# Patient Record
Sex: Female | Born: 1970 | Race: Black or African American | Hispanic: No | Marital: Married | Smoking: Never smoker
Health system: Southern US, Community
[De-identification: ages and names within clinical notes are randomized; demographics above are authoritative.]

## PROBLEM LIST (undated history)

## (undated) DIAGNOSIS — J45909 Unspecified asthma, uncomplicated: Secondary | ICD-10-CM

## (undated) DIAGNOSIS — L309 Dermatitis, unspecified: Secondary | ICD-10-CM

## (undated) DIAGNOSIS — K219 Gastro-esophageal reflux disease without esophagitis: Secondary | ICD-10-CM

## (undated) DIAGNOSIS — F419 Anxiety disorder, unspecified: Secondary | ICD-10-CM

## (undated) DIAGNOSIS — I10 Essential (primary) hypertension: Secondary | ICD-10-CM

## (undated) DIAGNOSIS — G4730 Sleep apnea, unspecified: Secondary | ICD-10-CM

## (undated) HISTORY — DX: Dermatitis, unspecified: L30.9

## (undated) HISTORY — PX: TUBAL LIGATION: SHX77

## (undated) HISTORY — DX: Unspecified asthma, uncomplicated: J45.909

---

## 2000-05-26 ENCOUNTER — Other Ambulatory Visit: Admission: RE | Admit: 2000-05-26 | Discharge: 2000-05-26 | Payer: Self-pay | Admitting: Gynecology

## 2000-05-29 ENCOUNTER — Encounter: Admission: RE | Admit: 2000-05-29 | Discharge: 2000-05-29 | Payer: Self-pay | Admitting: Gynecology

## 2000-05-29 ENCOUNTER — Encounter: Payer: Self-pay | Admitting: Gynecology

## 2010-01-10 ENCOUNTER — Emergency Department (HOSPITAL_COMMUNITY): Admission: EM | Admit: 2010-01-10 | Discharge: 2010-01-10 | Payer: Self-pay | Admitting: Emergency Medicine

## 2010-03-22 ENCOUNTER — Emergency Department (HOSPITAL_COMMUNITY)
Admission: EM | Admit: 2010-03-22 | Discharge: 2010-03-23 | Payer: Self-pay | Source: Home / Self Care | Admitting: Emergency Medicine

## 2010-03-26 ENCOUNTER — Emergency Department (HOSPITAL_COMMUNITY)
Admission: EM | Admit: 2010-03-26 | Discharge: 2010-03-26 | Payer: Self-pay | Source: Home / Self Care | Admitting: Emergency Medicine

## 2010-03-27 ENCOUNTER — Emergency Department (HOSPITAL_COMMUNITY)
Admission: EM | Admit: 2010-03-27 | Discharge: 2010-03-27 | Payer: Self-pay | Source: Home / Self Care | Admitting: Emergency Medicine

## 2010-04-20 ENCOUNTER — Emergency Department (HOSPITAL_COMMUNITY)
Admission: EM | Admit: 2010-04-20 | Discharge: 2010-04-20 | Payer: Self-pay | Source: Home / Self Care | Admitting: Emergency Medicine

## 2010-05-26 ENCOUNTER — Emergency Department (HOSPITAL_COMMUNITY)
Admission: EM | Admit: 2010-05-26 | Discharge: 2010-05-26 | Discharge status: Against Medical Advice | Payer: BC Managed Care – PPO | Attending: Emergency Medicine | Admitting: Emergency Medicine

## 2010-05-26 DIAGNOSIS — R110 Nausea: Secondary | ICD-10-CM | POA: Insufficient documentation

## 2010-05-26 DIAGNOSIS — E162 Hypoglycemia, unspecified: Secondary | ICD-10-CM | POA: Insufficient documentation

## 2010-05-26 LAB — GLUCOSE, CAPILLARY: Glucose-Capillary: 98 mg/dL (ref 70–99)

## 2010-06-10 LAB — BASIC METABOLIC PANEL
BUN: 10 mg/dL (ref 6–23)
CO2: 21 mEq/L (ref 19–32)
Calcium: 8.9 mg/dL (ref 8.4–10.5)
Chloride: 107 mEq/L (ref 96–112)
Creatinine, Ser: 0.76 mg/dL (ref 0.4–1.2)
GFR calc Af Amer: >60 mL/min (ref >60)
GFR calc non Af Amer: >60 mL/min (ref >60)
Glucose, Bld: 106 mg/dL — ABNORMAL HIGH (ref 70–99)
Potassium: 3.2 mEq/L — ABNORMAL LOW (ref 3.5–5.1)
Sodium: 136 mEq/L (ref 135–145)

## 2010-06-10 LAB — POCT CARDIAC MARKERS
CKMB, poc: <1 ng/mL — ABNORMAL LOW (ref 1.0–8.0)
CKMB, poc: <1 ng/mL — ABNORMAL LOW (ref 1.0–8.0)
CKMB, poc: <1 ng/mL — ABNORMAL LOW (ref 1.0–8.0)
Myoglobin, poc: 49.6 ng/mL (ref 12–200)
Myoglobin, poc: 50.5 ng/mL (ref 12–200)
Troponin i, poc: <0.05 ng/mL (ref 0.00–0.09)
Troponin i, poc: <0.05 ng/mL (ref 0.00–0.09)
Troponin i, poc: <0.05 ng/mL (ref 0.00–0.09)
Troponin i, poc: <0.05 ng/mL (ref 0.00–0.09)

## 2010-06-10 LAB — DIFFERENTIAL
Basophils Absolute: 0 10*3/uL (ref 0.0–0.1)
Eosinophils Absolute: 0.3 10*3/uL (ref 0.0–0.7)
Eosinophils Relative: 4 % (ref 0–5)

## 2010-06-10 LAB — CBC
MCH: 30.1 pg (ref 26.0–34.0)
MCV: 85.1 fL (ref 78.0–100.0)
Platelets: 289 10*3/uL (ref 150–400)
RDW: 12.5 % (ref 11.5–15.5)

## 2010-06-10 LAB — LIPASE, BLOOD: Lipase: 33 U/L (ref 11–59)

## 2010-06-13 LAB — URINALYSIS, ROUTINE W REFLEX MICROSCOPIC
Glucose, UA: NEGATIVE mg/dL
Nitrite: NEGATIVE
Protein, ur: 30 mg/dL — AB
Urobilinogen, UA: 1 mg/dL (ref 0.0–1.0)

## 2010-06-13 LAB — DIFFERENTIAL
Basophils Relative: 1 % (ref 0–1)
Monocytes Relative: 7 % (ref 3–12)
Neutro Abs: 2.3 10*3/uL (ref 1.7–7.7)
Neutrophils Relative %: 44 % (ref 43–77)

## 2010-06-13 LAB — CBC
Hemoglobin: 13.6 g/dL (ref 12.0–15.0)
MCHC: 35.5 g/dL (ref 30.0–36.0)
Platelets: 287 10*3/uL (ref 150–400)
RBC: 4.3 MIL/uL (ref 3.87–5.11)

## 2010-06-13 LAB — POCT I-STAT, CHEM 8
Chloride: 105 mEq/L (ref 96–112)
Creatinine, Ser: 0.9 mg/dL (ref 0.4–1.2)
Glucose, Bld: 109 mg/dL — ABNORMAL HIGH (ref 70–99)
Potassium: 3.5 mEq/L (ref 3.5–5.1)
Sodium: 140 mEq/L (ref 135–145)

## 2010-06-13 LAB — URINE CULTURE
Colony Count: NO GROWTH
Culture  Setup Time: 201110131005
Culture: NO GROWTH

## 2010-06-13 LAB — POCT CARDIAC MARKERS
CKMB, poc: <1 ng/mL — ABNORMAL LOW (ref 1.0–8.0)
Myoglobin, poc: 53.4 ng/mL (ref 12–200)
Troponin i, poc: <0.05 ng/mL (ref 0.00–0.09)

## 2010-06-13 LAB — URINE MICROSCOPIC-ADD ON

## 2010-06-13 LAB — RAPID URINE DRUG SCREEN, HOSP PERFORMED: Barbiturates: NOT DETECTED

## 2010-06-13 LAB — PREGNANCY, URINE: Preg Test, Ur: NEGATIVE

## 2010-08-27 ENCOUNTER — Emergency Department (HOSPITAL_COMMUNITY)
Admission: EM | Admit: 2010-08-27 | Discharge: 2010-08-27 | Disposition: A | Discharge status: Home / Self Care | Payer: BC Managed Care – PPO | Attending: Emergency Medicine | Admitting: Emergency Medicine

## 2010-08-27 DIAGNOSIS — F411 Generalized anxiety disorder: Secondary | ICD-10-CM | POA: Insufficient documentation

## 2010-08-27 DIAGNOSIS — R0789 Other chest pain: Secondary | ICD-10-CM | POA: Insufficient documentation

## 2010-08-27 DIAGNOSIS — R42 Dizziness and giddiness: Secondary | ICD-10-CM | POA: Insufficient documentation

## 2010-08-27 DIAGNOSIS — K219 Gastro-esophageal reflux disease without esophagitis: Secondary | ICD-10-CM | POA: Insufficient documentation

## 2010-08-27 LAB — CBC
HCT: 41.8 % (ref 36.0–46.0)
Hemoglobin: 14.3 g/dL (ref 12.0–15.0)
MCH: 29.2 pg (ref 26.0–34.0)
MCHC: 34.2 g/dL (ref 30.0–36.0)
MCV: 85.5 fL (ref 78.0–100.0)
Platelets: 319 K/uL (ref 150–400)
RBC: 4.89 MIL/uL (ref 3.87–5.11)
RDW: 12.3 % (ref 11.5–15.5)
WBC: 3.9 10*3/uL — ABNORMAL LOW (ref 4.0–10.5)

## 2010-08-27 LAB — DIFFERENTIAL
Basophils Absolute: 0 10*3/uL (ref 0.0–0.1)
Basophils Relative: 1 % (ref 0–1)
Eosinophils Absolute: 0.2 K/uL (ref 0.0–0.7)
Eosinophils Relative: 5 % (ref 0–5)
Lymphocytes Relative: 40 % (ref 12–46)
Lymphs Abs: 1.5 K/uL (ref 0.7–4.0)
Monocytes Absolute: 0.3 K/uL (ref 0.1–1.0)
Monocytes Relative: 7 % (ref 3–12)
Neutro Abs: 1.8 10*3/uL (ref 1.7–7.7)
Neutrophils Relative %: 47 % (ref 43–77)

## 2010-08-27 LAB — COMPREHENSIVE METABOLIC PANEL
ALT: 19 U/L (ref 0–35)
CO2: 24 mEq/L (ref 19–32)
Calcium: 9.2 mg/dL (ref 8.4–10.5)
Creatinine, Ser: 0.7 mg/dL (ref 0.4–1.2)
GFR calc non Af Amer: >60 mL/min (ref >60)
Glucose, Bld: 90 mg/dL (ref 70–99)
Sodium: 138 mEq/L (ref 135–145)
Total Protein: 7.3 g/dL (ref 6.0–8.3)

## 2010-08-27 LAB — COMPREHENSIVE METABOLIC PANEL WITH GFR
AST: 16 U/L (ref 0–37)
Albumin: 3.7 g/dL (ref 3.5–5.2)
Alkaline Phosphatase: 123 U/L — ABNORMAL HIGH (ref 39–117)
BUN: 12 mg/dL (ref 6–23)
Chloride: 102 meq/L (ref 96–112)
GFR calc Af Amer: >60 mL/min (ref >60)
Potassium: 3.6 meq/L (ref 3.5–5.1)
Total Bilirubin: 0.7 mg/dL (ref 0.3–1.2)

## 2010-08-27 LAB — CK TOTAL AND CKMB (NOT AT ARMC)
CK, MB: 1.5 ng/mL (ref 0.3–4.0)
Relative Index: 1.4 (ref 0.0–2.5)
Total CK: 107 U/L (ref 7–177)

## 2010-08-27 LAB — TROPONIN I: Troponin I: <0.3 ng/mL (ref <0.30)

## 2011-04-27 ENCOUNTER — Encounter (HOSPITAL_COMMUNITY): Payer: Self-pay | Admitting: *Deleted

## 2011-04-27 ENCOUNTER — Emergency Department (HOSPITAL_COMMUNITY)
Admission: EM | Admit: 2011-04-27 | Discharge: 2011-04-27 | Discharge status: Against Medical Advice | Payer: BC Managed Care – PPO | Attending: Emergency Medicine | Admitting: Emergency Medicine

## 2011-04-27 DIAGNOSIS — T148XXA Other injury of unspecified body region, initial encounter: Secondary | ICD-10-CM | POA: Insufficient documentation

## 2011-04-27 DIAGNOSIS — M79609 Pain in unspecified limb: Secondary | ICD-10-CM | POA: Insufficient documentation

## 2011-04-27 DIAGNOSIS — X58XXXA Exposure to other specified factors, initial encounter: Secondary | ICD-10-CM | POA: Insufficient documentation

## 2011-04-27 HISTORY — DX: Anxiety disorder, unspecified: F41.9

## 2011-04-27 HISTORY — DX: Gastro-esophageal reflux disease without esophagitis: K21.9

## 2011-04-27 MED ORDER — CYCLOBENZAPRINE HCL 10 MG PO TABS: 10.0000 mg | ORAL_TABLET | Freq: Three times a day (TID) | ORAL | Status: DC | PRN

## 2011-04-27 MED ORDER — OXYCODONE-ACETAMINOPHEN 5-325 MG PO TABS
2.0000 | ORAL_TABLET | Freq: Once | ORAL | Status: DC
  Filled 2011-04-27: qty 2

## 2011-04-27 NOTE — ED Provider Notes (Signed)
Medical screening examination/treatment/procedure(s) were performed by non-physician practitioner and as supervising physician I was immediately available for consultation/collaboration.   Joya Gaskins, MD 04/27/11 [phone]

## 2011-04-27 NOTE — ED Notes (Signed)
PT c/o R thigh pain that woke her from sleep. Pt denies injury. No evidence of bruising noted.

## 2011-04-27 NOTE — ED Notes (Signed)
Patient left AMA at this time.

## 2011-04-27 NOTE — ED Provider Notes (Signed)
History     CSN: [id number]  Arrival date & time 04/27/11  0035   First MD Initiated Contact with Patient 04/27/11 0214      Chief Complaint  Patient presents with  . Leg Pain     HPI  History provided by the patient. Patient is a 41 year old female with history of acid reflux and anxiety reports waking up from sleep with right groin and thigh pain. Pain is described as a sharp dull pain and ache. Pain is worse with palpation and movements of leg. Patient denies having any injury or trauma to her leg was asleep at that time. Patient denies any similar symptoms recently. Patient denies any back pain, numbness, tingling or weakness in legs. She denies rash or swelling. Patient has not taken anything for her symptoms. She denies any other aggravating or alleviating factors. Patient has no other significant past medical history.    Past Medical History  Diagnosis Date  . GERD (gastroesophageal reflux disease)   . Anxiety     History reviewed. No pertinent past surgical history.  History reviewed. No pertinent family history.  History  Substance Use Topics  . Smoking status: Never Smoker   . Smokeless tobacco: Not on file  . Alcohol Use: No    OB History    Grav Para Term Preterm Abortions TAB SAB Ect Mult Living                  Review of Systems  Constitutional: Negative for fever and chills.  Respiratory: Negative for cough and shortness of breath.   Cardiovascular: Negative for chest pain.  Gastrointestinal: Negative for nausea, vomiting and abdominal pain.  Genitourinary: Negative for dysuria, frequency, hematuria, flank pain, vaginal bleeding and vaginal discharge.  Musculoskeletal: Negative for myalgias and arthralgias.  All other systems reviewed and are negative.    Allergies  Review of patient's allergies indicates no known allergies.  Home Medications   Current Outpatient Rx  Name Route Sig Dispense Refill  . LORAZEPAM 0.5 MG PO TABS Oral Take 0.5 mg  by mouth as needed.      BP 127/87  Pulse 68  Temp(Src) 98.4 F (36.9 C) (Oral)  Resp 18  SpO2 100%  LMP 04/07/2011  Physical Exam  Nursing note and vitals reviewed. Constitutional: She is oriented to person, place, and time. She appears well-developed and well-nourished. No distress.  HENT:  Head: Normocephalic and atraumatic.  Cardiovascular: Normal rate and regular rhythm.   Pulmonary/Chest: Effort normal and breath sounds normal. No respiratory distress.  Abdominal: Soft. She exhibits no distension. There is no tenderness.  Musculoskeletal:       Pain with active range of motion of right lower leg. Pain with resistance to extension at right hip. Normal nonpainful passive range of motion in right hip and lower leg. Tenderness to palpation over the medial aspect of right upper leg over her quadriceps. Mild tenderness at the inguinal area. No lymphadenopathy, rash or skin changes. No swelling of leg. Normal femural pulse. Normal distal sensations and pulses her leg.  Neurological: She is alert and oriented to person, place, and time.  Skin: Skin is warm and dry. No rash noted.  Psychiatric: She has a normal mood and affect. Her behavior is normal.    ED Course  Procedures     1. Muscle strain       MDM  2:30AM patient seen and evaluated. Patient in no acute distress.  Patient discussed with attending physician. He will see  patient and evaluate.   Patient eloped without informing staff, she has left AMA prior to attending physician evaluation.      Angus Seller, [place] 04/27/11 [phone]

## 2012-06-02 ENCOUNTER — Other Ambulatory Visit: Payer: Self-pay | Admitting: Family Medicine

## 2012-06-03 ENCOUNTER — Ambulatory Visit: Payer: BC Managed Care – PPO

## 2012-06-04 ENCOUNTER — Ambulatory Visit: Payer: BC Managed Care – PPO

## 2012-06-16 ENCOUNTER — Other Ambulatory Visit (HOSPITAL_COMMUNITY): Payer: Self-pay | Admitting: Obstetrics and Gynecology

## 2012-06-16 ENCOUNTER — Encounter (HOSPITAL_COMMUNITY): Payer: Self-pay | Admitting: Pharmacist

## 2012-06-16 DIAGNOSIS — R1900 Intra-abdominal and pelvic swelling, mass and lump, unspecified site: Secondary | ICD-10-CM

## 2012-06-21 ENCOUNTER — Ambulatory Visit (HOSPITAL_COMMUNITY)
Admission: RE | Admit: 2012-06-21 | Discharge: 2012-06-21 | Disposition: A | Discharge status: Home / Self Care | Payer: BC Managed Care – PPO | Source: Ambulatory Visit | Attending: Obstetrics and Gynecology | Admitting: Obstetrics and Gynecology

## 2012-06-21 DIAGNOSIS — D252 Subserosal leiomyoma of uterus: Secondary | ICD-10-CM | POA: Insufficient documentation

## 2012-06-21 DIAGNOSIS — R1900 Intra-abdominal and pelvic swelling, mass and lump, unspecified site: Secondary | ICD-10-CM | POA: Insufficient documentation

## 2012-06-21 DIAGNOSIS — D251 Intramural leiomyoma of uterus: Secondary | ICD-10-CM | POA: Insufficient documentation

## 2012-06-21 DIAGNOSIS — N854 Malposition of uterus: Secondary | ICD-10-CM | POA: Insufficient documentation

## 2012-07-05 ENCOUNTER — Other Ambulatory Visit: Payer: Self-pay | Admitting: Obstetrics and Gynecology

## 2012-07-12 ENCOUNTER — Encounter (HOSPITAL_COMMUNITY)
Admission: RE | Admit: 2012-07-12 | Discharge: 2012-07-12 | Disposition: A | Discharge status: Home / Self Care | Payer: BC Managed Care – PPO | Source: Ambulatory Visit | Attending: Obstetrics and Gynecology | Admitting: Obstetrics and Gynecology

## 2012-07-12 ENCOUNTER — Encounter (HOSPITAL_COMMUNITY): Payer: Self-pay

## 2012-07-12 DIAGNOSIS — Z01818 Encounter for other preprocedural examination: Secondary | ICD-10-CM | POA: Insufficient documentation

## 2012-07-12 DIAGNOSIS — Z01812 Encounter for preprocedural laboratory examination: Secondary | ICD-10-CM | POA: Insufficient documentation

## 2012-07-12 HISTORY — DX: Sleep apnea, unspecified: G47.30

## 2012-07-12 LAB — SURGICAL PCR SCREEN
MRSA, PCR: NEGATIVE
Staphylococcus aureus: NEGATIVE

## 2012-07-12 LAB — CBC
Hemoglobin: 12.8 g/dL (ref 12.0–15.0)
MCH: 29.5 pg (ref 26.0–34.0)
RBC: 4.34 MIL/uL (ref 3.87–5.11)

## 2012-07-12 NOTE — Patient Instructions (Signed)
Your procedure is scheduled on:07/19/12  Enter through the Main Entrance at :6am Pick up desk phone and dial [phone] and inform us of your arrival.  Please call [phone] if you have any problems the morning of surgery.  Remember: Do not eat or drink after midnight:SUNDAY   Take these meds the morning of surgery with a sip of water: Ativan if needed  DO NOT wear jewelry, eye make-up, lipstick,body lotion, or dark fingernail polish  If you are to be admitted after surgery, leave suitcase in car until your room has been assigned. Patients discharged on the day of surgery will not be allowed to drive home.

## 2012-07-18 MED ORDER — DEXTROSE 5 % IV SOLN
2.0000 g | INTRAVENOUS | Status: AC
  Administered 2012-07-19: 2 g via INTRAVENOUS
  Filled 2012-07-18: qty 2

## 2012-07-19 ENCOUNTER — Observation Stay (HOSPITAL_COMMUNITY)
Admission: RE | Admit: 2012-07-19 | Discharge: 2012-07-21 | Disposition: A | Discharge status: Home / Self Care | Payer: BC Managed Care – PPO | Source: Ambulatory Visit | Attending: Obstetrics and Gynecology | Admitting: Obstetrics and Gynecology

## 2012-07-19 ENCOUNTER — Inpatient Hospital Stay (HOSPITAL_COMMUNITY): Payer: BC Managed Care – PPO | Admitting: Anesthesiology

## 2012-07-19 ENCOUNTER — Encounter (HOSPITAL_COMMUNITY)
Admission: RE | Disposition: A | Discharge status: Home / Self Care | Payer: Self-pay | Source: Ambulatory Visit | Attending: Obstetrics and Gynecology

## 2012-07-19 ENCOUNTER — Encounter (HOSPITAL_COMMUNITY): Payer: Self-pay | Admitting: Anesthesiology

## 2012-07-19 ENCOUNTER — Encounter (HOSPITAL_COMMUNITY): Payer: Self-pay | Admitting: Registered Nurse

## 2012-07-19 DIAGNOSIS — N838 Other noninflammatory disorders of ovary, fallopian tube and broad ligament: Secondary | ICD-10-CM | POA: Insufficient documentation

## 2012-07-19 DIAGNOSIS — D219 Benign neoplasm of connective and other soft tissue, unspecified: Secondary | ICD-10-CM

## 2012-07-19 DIAGNOSIS — N736 Female pelvic peritoneal adhesions (postinfective): Secondary | ICD-10-CM | POA: Insufficient documentation

## 2012-07-19 DIAGNOSIS — D251 Intramural leiomyoma of uterus: Principal | ICD-10-CM | POA: Insufficient documentation

## 2012-07-19 HISTORY — PX: ABDOMINAL HYSTERECTOMY: SHX81

## 2012-07-19 LAB — TYPE AND SCREEN
ABO/RH(D): B POS
Antibody Screen: NEGATIVE

## 2012-07-19 SURGERY — HYSTERECTOMY, ABDOMINAL
Anesthesia: General | Site: Abdomen | Laterality: Bilateral | Wound class: Clean Contaminated

## 2012-07-19 MED ORDER — NALOXONE HCL 0.4 MG/ML IJ SOLN: 0.4000 mg | INTRAMUSCULAR | Status: DC | PRN

## 2012-07-19 MED ORDER — FENTANYL CITRATE 0.05 MG/ML IJ SOLN
INTRAMUSCULAR | Status: DC | PRN
  Administered 2012-07-19: 50 ug via INTRAVENOUS
  Administered 2012-07-19: 150 ug via INTRAVENOUS
  Administered 2012-07-19 (×4): 50 ug via INTRAVENOUS
  Administered 2012-07-19: 100 ug via INTRAVENOUS

## 2012-07-19 MED ORDER — ROCURONIUM BROMIDE 50 MG/5ML IV SOLN
INTRAVENOUS | Status: AC
  Filled 2012-07-19: qty 1

## 2012-07-19 MED ORDER — DEXAMETHASONE SODIUM PHOSPHATE 10 MG/ML IJ SOLN
INTRAMUSCULAR | Status: DC | PRN
  Administered 2012-07-19: 10 mg via INTRAVENOUS

## 2012-07-19 MED ORDER — PROPOFOL 10 MG/ML IV BOLUS
INTRAVENOUS | Status: DC | PRN
  Administered 2012-07-19: 200 mg via INTRAVENOUS

## 2012-07-19 MED ORDER — DEXTROSE IN LACTATED RINGERS 5 % IV SOLN
INTRAVENOUS | Status: DC
  Administered 2012-07-19 (×2): via INTRAVENOUS

## 2012-07-19 MED ORDER — CITRIC ACID-SODIUM CITRATE 334-500 MG/5ML PO SOLN: 30.0000 mL | Freq: Once | ORAL | Status: AC

## 2012-07-19 MED ORDER — DIPHENHYDRAMINE HCL 12.5 MG/5ML PO ELIX: 12.5000 mg | ORAL_SOLUTION | Freq: Four times a day (QID) | ORAL | Status: DC | PRN

## 2012-07-19 MED ORDER — PROPOFOL 10 MG/ML IV EMUL
INTRAVENOUS | Status: AC
  Filled 2012-07-19: qty 20

## 2012-07-19 MED ORDER — CITRIC ACID-SODIUM CITRATE 334-500 MG/5ML PO SOLN
ORAL | Status: AC
  Administered 2012-07-19: 30 mL via ORAL
  Filled 2012-07-19: qty 15

## 2012-07-19 MED ORDER — ROCURONIUM BROMIDE 100 MG/10ML IV SOLN
INTRAVENOUS | Status: DC | PRN
  Administered 2012-07-19: 55 mg via INTRAVENOUS
  Administered 2012-07-19: 5 mg via INTRAVENOUS
  Administered 2012-07-19: 10 mg via INTRAVENOUS

## 2012-07-19 MED ORDER — 0.9 % SODIUM CHLORIDE (POUR BTL) OPTIME
TOPICAL | Status: DC | PRN
  Administered 2012-07-19: 1000 mL

## 2012-07-19 MED ORDER — NEOSTIGMINE METHYLSULFATE 1 MG/ML IJ SOLN
INTRAMUSCULAR | Status: AC
  Filled 2012-07-19: qty 1

## 2012-07-19 MED ORDER — HYDROMORPHONE 0.3 MG/ML IV SOLN
INTRAVENOUS | Status: DC
  Administered 2012-07-19: 0.3 mg via INTRAVENOUS
  Administered 2012-07-19: 12:00:00 via INTRAVENOUS
  Administered 2012-07-19: 1.8 mg via INTRAVENOUS
  Administered 2012-07-20: 0.6 mg via INTRAVENOUS
  Administered 2012-07-20: 0.9 mg via INTRAVENOUS
  Filled 2012-07-19: qty 25

## 2012-07-19 MED ORDER — DIPHENHYDRAMINE HCL 50 MG/ML IJ SOLN: 12.5000 mg | Freq: Four times a day (QID) | INTRAMUSCULAR | Status: DC | PRN

## 2012-07-19 MED ORDER — MIDAZOLAM HCL 2 MG/2ML IJ SOLN
INTRAMUSCULAR | Status: AC
  Filled 2012-07-19: qty 2

## 2012-07-19 MED ORDER — FENTANYL CITRATE 0.05 MG/ML IJ SOLN
INTRAMUSCULAR | Status: AC
  Filled 2012-07-19: qty 2

## 2012-07-19 MED ORDER — SUCCINYLCHOLINE CHLORIDE 20 MG/ML IJ SOLN
INTRAMUSCULAR | Status: DC | PRN
  Administered 2012-07-19: 120 mg via INTRAVENOUS

## 2012-07-19 MED ORDER — OXYCODONE-ACETAMINOPHEN 5-325 MG PO TABS
1.0000 | ORAL_TABLET | ORAL | Status: DC | PRN
  Administered 2012-07-20 (×3): 2 via ORAL
  Administered 2012-07-21: 1 via ORAL
  Filled 2012-07-19 (×3): qty 2
  Filled 2012-07-19: qty 1

## 2012-07-19 MED ORDER — GLYCOPYRROLATE 0.2 MG/ML IJ SOLN
INTRAMUSCULAR | Status: DC | PRN
  Administered 2012-07-19: 0.4 mg via INTRAVENOUS

## 2012-07-19 MED ORDER — FENTANYL CITRATE 0.05 MG/ML IJ SOLN
25.0000 ug | INTRAMUSCULAR | Status: DC | PRN
  Administered 2012-07-19: 50 ug via INTRAVENOUS

## 2012-07-19 MED ORDER — DEXAMETHASONE SODIUM PHOSPHATE 10 MG/ML IJ SOLN
INTRAMUSCULAR | Status: AC
  Filled 2012-07-19: qty 1

## 2012-07-19 MED ORDER — HYDROMORPHONE HCL PF 1 MG/ML IJ SOLN
INTRAMUSCULAR | Status: AC
  Filled 2012-07-19: qty 1

## 2012-07-19 MED ORDER — FENTANYL CITRATE 0.05 MG/ML IJ SOLN
INTRAMUSCULAR | Status: AC
  Filled 2012-07-19: qty 5

## 2012-07-19 MED ORDER — SUCCINYLCHOLINE CHLORIDE 20 MG/ML IJ SOLN
INTRAMUSCULAR | Status: AC
  Filled 2012-07-19: qty 10

## 2012-07-19 MED ORDER — FENTANYL CITRATE 0.05 MG/ML IJ SOLN
INTRAMUSCULAR | Status: AC
  Administered 2012-07-19: 50 ug via INTRAVENOUS
  Filled 2012-07-19: qty 2

## 2012-07-19 MED ORDER — LIDOCAINE HCL (CARDIAC) 20 MG/ML IV SOLN
INTRAVENOUS | Status: AC
  Filled 2012-07-19: qty 5

## 2012-07-19 MED ORDER — NEOSTIGMINE METHYLSULFATE 1 MG/ML IJ SOLN
INTRAMUSCULAR | Status: DC | PRN
  Administered 2012-07-19: 3 mg via INTRAVENOUS

## 2012-07-19 MED ORDER — GLYCOPYRROLATE 0.2 MG/ML IJ SOLN
INTRAMUSCULAR | Status: AC
  Filled 2012-07-19: qty 2

## 2012-07-19 MED ORDER — ONDANSETRON HCL 4 MG/2ML IJ SOLN
INTRAMUSCULAR | Status: DC | PRN
  Administered 2012-07-19: 4 mg via INTRAVENOUS

## 2012-07-19 MED ORDER — LIDOCAINE HCL (CARDIAC) 20 MG/ML IV SOLN
INTRAVENOUS | Status: DC | PRN
  Administered 2012-07-19: 80 mg via INTRAVENOUS

## 2012-07-19 MED ORDER — FAMOTIDINE IN NACL 20-0.9 MG/50ML-% IV SOLN
20.0000 mg | Freq: Two times a day (BID) | INTRAVENOUS | Status: DC
  Administered 2012-07-19 (×2): 20 mg via INTRAVENOUS
  Filled 2012-07-19 (×4): qty 50

## 2012-07-19 MED ORDER — ONDANSETRON HCL 4 MG/2ML IJ SOLN: 4.0000 mg | Freq: Four times a day (QID) | INTRAMUSCULAR | Status: DC | PRN

## 2012-07-19 MED ORDER — LACTATED RINGERS IV SOLN
INTRAVENOUS | Status: DC
  Administered 2012-07-19 (×4): via INTRAVENOUS

## 2012-07-19 MED ORDER — MIDAZOLAM HCL 5 MG/5ML IJ SOLN
INTRAMUSCULAR | Status: DC | PRN
  Administered 2012-07-19: 2 mg via INTRAVENOUS

## 2012-07-19 MED ORDER — ONDANSETRON HCL 4 MG/2ML IJ SOLN
INTRAMUSCULAR | Status: AC
  Filled 2012-07-19: qty 2

## 2012-07-19 MED ORDER — HYDROMORPHONE HCL PF 1 MG/ML IJ SOLN
INTRAMUSCULAR | Status: DC | PRN
  Administered 2012-07-19 (×2): 0.5 mg via INTRAVENOUS

## 2012-07-19 MED ORDER — SODIUM CHLORIDE 0.9 % IJ SOLN: 9.0000 mL | INTRAMUSCULAR | Status: DC | PRN

## 2012-07-19 SURGICAL SUPPLY — 34 items
CANISTER SUCTION 2500CC (MISCELLANEOUS) ×2 IMPLANT
CELLS DAT CNTRL 66122 CELL SVR (MISCELLANEOUS) IMPLANT
CLOTH BEACON ORANGE TIMEOUT ST (SAFETY) ×2 IMPLANT
CONT PATH 16OZ SNAP LID 3702 (MISCELLANEOUS) ×2 IMPLANT
DECANTER SPIKE VIAL GLASS SM (MISCELLANEOUS) IMPLANT
DRSG OPSITE POSTOP 4X10 (GAUZE/BANDAGES/DRESSINGS) ×1 IMPLANT
GLOVE ECLIPSE 7.0 STRL STRAW (GLOVE) ×4 IMPLANT
GOWN PREVENTION PLUS LG XLONG (DISPOSABLE) ×4 IMPLANT
GOWN PREVENTION PLUS XLARGE (GOWN DISPOSABLE) ×2 IMPLANT
NDL HYPO 25X1 1.5 SAFETY (NEEDLE) IMPLANT
NEEDLE HYPO 25X1 1.5 SAFETY (NEEDLE) IMPLANT
NS IRRIG 1000ML POUR BTL (IV SOLUTION) ×2 IMPLANT
PACK ABDOMINAL GYN (CUSTOM PROCEDURE TRAY) ×2 IMPLANT
PAD OB MATERNITY 4.3X12.25 (PERSONAL CARE ITEMS) ×2 IMPLANT
PROTECTOR NERVE ULNAR (MISCELLANEOUS) ×2 IMPLANT
RETRACTOR WND ALEXIS 18 MED (MISCELLANEOUS) IMPLANT
RETRACTOR WND ALEXIS 25 LRG (MISCELLANEOUS) IMPLANT
RTRCTR WOUND ALEXIS 18CM MED (MISCELLANEOUS)
RTRCTR WOUND ALEXIS 25CM LRG (MISCELLANEOUS)
SPONGE LAP 18X18 X RAY DECT (DISPOSABLE) ×5 IMPLANT
STAPLER VISISTAT 35W (STAPLE) ×2 IMPLANT
SUT MNCRL 0 MO-4 VIOLET 18 CR (SUTURE) ×3 IMPLANT
SUT MNCRL 0 VIOLET 6X18 (SUTURE) ×1 IMPLANT
SUT MNCRL AB 0 CT1 27 (SUTURE) ×4 IMPLANT
SUT MON AB 2-0 CT1 27 (SUTURE) ×1 IMPLANT
SUT MONOCRYL 0 6X18 (SUTURE) ×1
SUT MONOCRYL 0 MO 4 18  CR/8 (SUTURE) ×3
SUT PDS AB 0 CTX 36 PDP370T (SUTURE) ×2 IMPLANT
SUT PLAIN 2 0 XLH (SUTURE) ×1 IMPLANT
SUT SILK 3 0 SH 30 (SUTURE) IMPLANT
SYR CONTROL 10ML LL (SYRINGE) IMPLANT
TOWEL OR 17X24 6PK STRL BLUE (TOWEL DISPOSABLE) ×4 IMPLANT
TRAY FOLEY CATH 14FR (SET/KITS/TRAYS/PACK) ×2 IMPLANT
WATER STERILE IRR 1000ML POUR (IV SOLUTION) ×2 IMPLANT

## 2012-07-19 NOTE — Anesthesia Preprocedure Evaluation (Addendum)
Anesthesia Evaluation  Patient identified by MRN, date of birth, ID band Patient awake    Reviewed: Allergy & Precautions, H&P , NPO status , Patient's Chart, lab work & pertinent test results  Airway Mallampati: I TM Distance: >3 FB Neck ROM: Full    Dental no notable dental hx.    Pulmonary sleep apnea and Continuous Positive Airway Pressure Ventilation ,  breath sounds clear to auscultation  Pulmonary exam normal       Cardiovascular negative cardio ROS  Rhythm:Regular Rate:Normal     Neuro/Psych negative neurological ROS  negative psych ROS   GI/Hepatic Neg liver ROS, GERD-  Medicated and Poorly Controlled,  Endo/Other  negative endocrine ROS  Renal/GU negative Renal ROS  negative genitourinary   Musculoskeletal negative musculoskeletal ROS (+)   Abdominal   Peds negative pediatric ROS (+)  Hematology negative hematology ROS (+)   Anesthesia Other Findings   Reproductive/Obstetrics negative OB ROS                         Anesthesia Physical Anesthesia Plan  ASA: II  Anesthesia Plan: General   Post-op Pain Management:    Induction: Intravenous, Rapid sequence and Cricoid pressure planned  Airway Management Planned: Oral ETT  Additional Equipment:   Intra-op Plan:   Post-operative Plan: Extubation in OR  Informed Consent: I have reviewed the patients History and Physical, chart, labs and discussed the procedure including the risks, benefits and alternatives for the proposed anesthesia with the patient or authorized representative who has indicated his/her understanding and acceptance.   Dental advisory given  Plan Discussed with: CRNA  Anesthesia Plan Comments: (Severe reflux this AM.  RSI. Bicitra pre-op)       Anesthesia Quick Evaluation

## 2012-07-19 NOTE — Progress Notes (Signed)
Ur chart review completed.  

## 2012-07-19 NOTE — Anesthesia Postprocedure Evaluation (Signed)
  Anesthesia Post-op Note  Patient: Andrea Bruce  Procedure(s) Performed: Procedure(s): HYSTERECTOMY ABDOMINAL WITH BILATERAL SALPINGECTOMY AND LYSIS OF ADHESIONS (Bilateral) Patient is awake and responsive. Pain and nausea are reasonably well controlled. Vital signs are stable and clinically acceptable. Oxygen saturation is clinically acceptable. There are no apparent anesthetic complications at this time. Patient is ready for discharge.

## 2012-07-19 NOTE — Transfer of Care (Signed)
Immediate Anesthesia Transfer of Care Note  Patient: Andrea Bruce  Procedure(s) Performed: Procedure(s): HYSTERECTOMY ABDOMINAL WITH BILATERAL SALPINGECTOMY AND LYSIS OF ADHESIONS (Bilateral)  Patient Location: PACU  Anesthesia Type:General  Level of Consciousness: awake, alert  and oriented  Airway & Oxygen Therapy: Patient Spontanous Breathing and Patient connected to nasal cannula oxygen  Post-op Assessment: Report given to PACU RN  Post vital signs: Reviewed  Complications: No apparent anesthesia complications

## 2012-07-19 NOTE — Op Note (Signed)
NAMEKEHINDE, TOTZKE NO.:  [id number]  MEDICAL RECORD NO.:  [id number]  LOCATION:  9304                          FACILITY:  [facility]  PHYSICIAN:  Malva Limes, M.D.    DATE OF BIRTH:  [birth year]  DATE OF PROCEDURE:  07/19/2012 DATE OF DISCHARGE:                              OPERATIVE REPORT   PREOPERATIVE DIAGNOSIS:  Large symptomatic uterine fibroids.  POSTOPERATIVE DIAGNOSIS:  Large symptomatic uterine fibroids with extensive adhesions.  SURGEON:  Malva Limes, M.D.  ASSISTANT:  Luvenia Redden, M.D.  PROCEDURES: 1. Total abdominal hysterectomy with bilateral salpingectomy. 2. Extensive lysis of adhesions.  ANESTHESIA:  General endotracheal.  ANTIBIOTIC:  Cefotan 1 g.  DRAINS:  Foley to bedside drainage.  ESTIMATED BLOOD LOSS:  500 mL.  COMPLICATIONS:  None.  SPECIMENS:  Cervix, uterus, fibroids, and fallopian tube sent to Pathology.  PROCEDURE:  The patient was taken to the operating room where a general anesthetic was administered without difficulty.  She was placed in dorsal supine position with a left lateral tilt.  A Foley catheter had been placed.  An exam under anesthesia revealed a pelvic mass approximately 3-cm above the umbilicus.  The patient was then draped in usual fashion.  A vertical skin incision was made from the base of the umbilicus to the suprapubic symphysis.  The fascia was opened with a knife and Mayo scissors.  Parietal peritoneum was entered with blunt dissection.  There was quite a lot of difficulty entering the abdominal cavity because the uterus was adherent to the anterior abdominal wall. There was also extensive large blood vessels from the omentum feeding into the anterior abdominal wall and the anterior surface of the uterus. Once the peritoneal cavity was entered, the uterus was dissected free from the anterior abdominal wall and the omentum was separated from the uterus.  The large blood vessels were clamped,  cut, and ligated with 0 Monocryl suture.  Once the fundus of the uterus was freed, it was lifted up out of the abdomen and O'Sullivan-O'Connor retractor was placed and the bowel packed away with two wet laps.  Both ovaries were identified and appeared to be normal.  Fallopian tubes were normal.  At this point, the round ligament on the right was clamped, cut, and tied with 0 Monocryl suture x2.  The anterior and posterior leaf of the broad ligament was opened and the ureter identified.  The ovarian ligament and fallopian tube were then clamped, cut, and ligated with 0 Monocryl suture.  The uterine vessel was then skeletonized, clamped, cut, and ligated with 0 Monocryl suture.  There was a large fibroid in the right adnexa making dissection difficult.  The ureter was identified and appeared to be far from the field of surgery.  Similar procedure was performed on the opposite side.  Once the uterine vessels were ligated, the cervix was transected with scissors and the specimen was removed. The cervix was then dissected free.  Cardinal ligaments were clamped, cut, and ligated with 0 Monocryl suture.  The bladder had previously been dissected away from the surgical field.  The anterior vagina was entered with a knife and circumscribed with scissors.  The cervix was  remained.  The vaginal angles were closed using 0 Monocryl suture in a Heaney fashion.  The remaining vaginal cuff was closed using interrupted 0 Monocryl suture in figure-of-eight fashion.  The pelvis was then irrigated and felt to be hemostatic.  Fallopian tubes were then identified and freed with the Bovie.  The ovaries were then lifted out of the pelvis and the stumps of the round ligament and ovarian pedicle sutured together bilaterally.  The pelvis was copiously irrigated and again felt to be hemostatic.  The retractor was removed.  The laps were removed.  Lap count was performed and was correct.  The abdomen was then closed  using 0 PDS in a running Snead-Jones fashion.  Sutures were ligated superiorly and inferiorly, and also in the midline of the incision.  The subcuticular tissue was irrigated and closed with interrupted 2-0 plain sutures.  Stainless steel clips were used to close the skin.  The patient tolerated the procedure well.  She was taken to the recovery room in stable condition.  Instrument and lap count was correct x3.          ______________________________ Malva Limes, M.D.     MA/MEDQ  D:  07/19/2012  T:  07/19/2012  Job:  [id number]

## 2012-07-19 NOTE — Preoperative (Signed)
Beta Blockers   Reason not to administer Beta Blockers:Not Applicable 

## 2012-07-19 NOTE — H&P (Signed)
Pt is a 42 year old black female who presents to the OR for a TAH, bilateral salpingectomy for symptomatic large uterine fibroids. Pt has had two studies which confirm that the mass is likely large uterine fibroids. The right ovary could not be seen on either study. PE: VSSAF          HEENT- wnl         CV- rrr without m          ABD- large firm mobile mass to 3 cm above umbilicus.         Pelvic- ext- wnl                     Vag- without discharge.                     Cx- no lesions                     Adnexa- no masses palp IMP/ symptomatic fibroids PLAN/ TAH, bilateral salpingectomy.

## 2012-07-20 ENCOUNTER — Encounter (HOSPITAL_COMMUNITY): Payer: Self-pay | Admitting: Obstetrics and Gynecology

## 2012-07-20 LAB — CBC
HCT: 29.1 % — ABNORMAL LOW (ref 36.0–46.0)
Hemoglobin: 10.1 g/dL — ABNORMAL LOW (ref 12.0–15.0)
MCH: 29.2 pg (ref 26.0–34.0)
MCHC: 34.7 g/dL (ref 30.0–36.0)

## 2012-07-20 MED ORDER — FAMOTIDINE 20 MG PO TABS
20.0000 mg | ORAL_TABLET | Freq: Two times a day (BID) | ORAL | Status: DC
  Administered 2012-07-20: 20 mg via ORAL
  Filled 2012-07-20: qty 1

## 2012-07-20 NOTE — Anesthesia Postprocedure Evaluation (Signed)
  Anesthesia Post-op Note  Patient: Andrea Bruce  Procedure(s) Performed: Procedure(s): HYSTERECTOMY ABDOMINAL WITH BILATERAL SALPINGECTOMY AND LYSIS OF ADHESIONS (Bilateral)  Patient Location: [facility]  Anesthesia Type:General  Level of Consciousness: awake, alert  and oriented  Airway and Oxygen Therapy: Patient Spontanous Breathing and Patient connected to nasal cannula oxygen  Post-op Pain: none  Post-op Assessment: Post-op Vital signs reviewed and Patient's Cardiovascular Status Stable  Post-op Vital Signs: Reviewed and stable  Complications: No apparent anesthesia complications

## 2012-07-20 NOTE — Progress Notes (Signed)
The patient is receiving famotidine by the intravenous route.  Based on criteria approved by the Pharmacy and Therapeutics Committee and the Medical Executive Committee, the medication is being converted to the equivalent oral dose form.  These criteria include: -No Active GI bleeding -Able to tolerate diet of full liquids (or better) or tube feeding -Able to tolerate other medications by the oral or enteral route  If you have any questions about this conversion, please contact the Pharmacy Department (ext [phone]).  Thank you.  

## 2012-07-21 MED ORDER — OXYCODONE-ACETAMINOPHEN 5-325 MG PO TABS: 1.0000 | ORAL_TABLET | ORAL | Status: DC | PRN

## 2012-07-21 NOTE — Progress Notes (Signed)
POD#2 Pt without complaints. Has had a BM. Tolerating pain.  VSSAF PLAN/ Will discharge to home and return to office on Friday for staple removal.

## 2012-07-21 NOTE — Discharge Summary (Signed)
NAMESYDNIE, SIGMUND NO.:  [id number]  MEDICAL RECORD NO.:  [id number]  LOCATION:  9304                          FACILITY:  [facility]  PHYSICIAN:  Malva Limes, M.D.    DATE OF BIRTH:  [birth year]  DATE OF ADMISSION:  07/19/2012 DATE OF DISCHARGE:                              DISCHARGE SUMMARY   PRINCIPAL DISCHARGE DIAGNOSES: 1. Symptomatic uterine fibroids. 2. Abdominal and pelvic adhesions.  PRINCIPLE PROCEDURES: 1. Total abdominal hysterectomy. 2. Lysis of adhesions.  HISTORY OF PRESENT ILLNESS:  Ms. Andrea Bruce is a 42 year old black female, who presented to [facility] on July 19, 2012 for total abdominal hysterectomy with bilateral salpingectomy secondary to large symptomatic uterine fibroids.  HOSPITAL COURSE:  The patient was admitted and underwent this procedure. A complete description of this can be found in dictated operative note. The patient's postop course was benign.  Her postop hemoglobin was 10.1. During the postop course, patient remained afebrile.  She was ambulating without difficulty.  At the time of discharge, she did have a bowel movement.  She had normoactive bowel sounds.  She was tolerating a diet. She had adequate pain control.  Her incision appeared to be healing well.  Patient will be discharged to home.  She will be instructed to follow up in the office in 2 days for staple removal.  She will call the office with any temperature elevations or change in her incision.          ______________________________ Malva Limes, M.D.     MA/MEDQ  D:  07/21/2012  T:  07/21/2012  Job:  [id number]

## 2012-07-21 NOTE — Progress Notes (Signed)
Pt is discharged in the care of husband. Downstairs per ambulatory. Stable. Denies any pain, heavy vaginal bleeding ,or incisional problems. Staples are in place and will be discontinued in office on this Friday. Discharge instructions were given with good understanding. Questions were asked and answered.

## 2013-04-24 ENCOUNTER — Ambulatory Visit (INDEPENDENT_AMBULATORY_CARE_PROVIDER_SITE_OTHER): Payer: BC Managed Care – PPO | Admitting: Family Medicine

## 2013-04-24 VITALS — BP 122/74 | HR 92 | Temp 98.4°F | Resp 17 | Ht 64.0 in | Wt 192.0 lb

## 2013-04-24 DIAGNOSIS — J329 Chronic sinusitis, unspecified: Secondary | ICD-10-CM

## 2013-04-24 MED ORDER — LEVOFLOXACIN 500 MG PO TABS: 500.0000 mg | ORAL_TABLET | Freq: Every day | ORAL | Status: DC

## 2013-04-24 MED ORDER — METHYLPREDNISOLONE ACETATE 80 MG/ML IJ SUSP
80.0000 mg | Freq: Once | INTRAMUSCULAR | Status: AC
  Administered 2013-04-24: 80 mg via INTRAMUSCULAR

## 2013-04-24 MED ORDER — IPRATROPIUM BROMIDE 0.03 % NA SOLN: 2.0000 | Freq: Four times a day (QID) | NASAL | Status: DC

## 2013-04-24 MED ORDER — FLUTICASONE PROPIONATE 50 MCG/ACT NA SUSP: 2.0000 | Freq: Every day | NASAL | Status: DC

## 2013-04-24 MED ORDER — HYDROCOD POLST-CHLORPHEN POLST 10-8 MG/5ML PO LQCR: 5.0000 mL | Freq: Two times a day (BID) | ORAL | Status: DC | PRN

## 2013-04-24 MED ORDER — PSEUDOEPHEDRINE HCL ER 120 MG PO TB12: 120.0000 mg | ORAL_TABLET | Freq: Two times a day (BID) | ORAL | Status: DC

## 2013-04-24 NOTE — Progress Notes (Signed)
Subjective:    Patient ID: Burna Cash, female    DOB: 07-May-1970, 43 y.o.   MRN: [id number]  HPI  This chart was scribed for Shawnee Knapp, MD, by Sydell Axon, ED Scribe. This patient was seen in room 3 and the patient's care was started at 9:59 AM.  Chief Complaint  Patient presents with  . Cough  . Nasal Congestion  . URI  . Sinusitis  . Eye Pain    HPI Comments: DEDE DOBESH is a 43 y.o. female who presents to the [facility] complaining of constant sore throat with associated dry cough with initial onset 4-5 days ago. Her cough is non-productive, though she feels congested and has associated CP with cough effort. Additionally, she has had pain in her cheek bones that radiates to her teeth, bilaterally, with pain worse on the Lt side of the face. She has had rhinorrhea with clear sputum and chills. Patient denies any ear pain or fever. She states that she has had eye pain and irritation with visual distrubances from the sinus pressure and difficult sneezing with some tearing. Her symptoms have caused some sleep disturbances esp as she uses CPAP for OSA which is very difficult to wear when so congestion. She has tried taking zyrtec with minimal improvement lasting one day. Benadryl only helps for a few hours. Was trying to do a lot of hot soups and teas.  She is using a humidifier constantly.   Patient has a history of GERD.  Past Medical History  Diagnosis Date  . Anxiety   . GERD (gastroesophageal reflux disease)     uses papaya seeds for heartburn  . Sleep apnea     uses a CPAP    Past Surgical History  Procedure Laterality Date  . Tubal ligation    . Abdominal hysterectomy Bilateral 07/19/2012    Procedure: HYSTERECTOMY ABDOMINAL WITH BILATERAL SALPINGECTOMY AND LYSIS OF ADHESIONS;  Surgeon: Olga Millers, MD;  Location: [facility];  Service: Gynecology;  Laterality: Bilateral;    No family history on file.  History   Social History  . Marital  Status: Married    Spouse Name: N/A    Number of Children: N/A  . Years of Education: N/A   Occupational History  . Not on file.   Social History Main Topics  . Smoking status: Never Smoker   . Smokeless tobacco: Not on file  . Alcohol Use: No  . Drug Use: No  . Sexual Activity: No   Other Topics Concern  . Not on file   Social History Narrative  . No narrative on file    Allergies  Allergen Reactions  . Penicillins     Childhood reaction  . Latex Hives and Rash    Gloves & condoms    Current Outpatient Prescriptions on File Prior to Visit  Medication Sig Dispense Refill  . LORazepam (ATIVAN) 0.5 MG tablet Take 0.5 mg by mouth as needed for anxiety.        No current facility-administered medications on file prior to visit.    Review of Systems  Constitutional: Negative for fever, diaphoresis, activity change and appetite change.  HENT: Positive for congestion, dental problem, postnasal drip, rhinorrhea, sinus pressure, sneezing and sore throat. Negative for ear pain and trouble swallowing.   Eyes: Positive for pain, discharge, itching and visual disturbance. Negative for photophobia and redness.  Respiratory: Positive for cough and chest tightness (With cough). Negative for shortness of  breath.   Cardiovascular: Negative for chest pain and leg swelling.  Gastrointestinal: Negative for nausea, vomiting, abdominal pain and diarrhea.  Genitourinary: Negative.   Musculoskeletal: Negative for myalgias and neck pain.  Skin: Negative.   Allergic/Immunologic: Negative for environmental allergies and food allergies.  Neurological: Positive for headaches. Negative for light-headedness and numbness.  Psychiatric/Behavioral: Positive for sleep disturbance.    BP 122/74  Pulse 92  Temp(Src) 98.4 F (36.9 C) (Oral)  Resp 17  Ht 5\' 4"  (1.626 m)  Wt 192 lb (87.091 kg)  BMI 32.94 kg/m2  SpO2 99%  LMP 04/24/2013 Objective:   Physical Exam  Nursing note and vitals  reviewed. Constitutional: She is oriented to person, place, and time. She appears well-developed and well-nourished. No distress.  HENT:  Head: Normocephalic and atraumatic.  Right Ear: A middle ear effusion (mild) is present.  Left Ear: A middle ear effusion (mild) is present.  Nose: Rhinorrhea present.  Mouth/Throat: Posterior oropharyngeal erythema (mild) present.  Some cerumen buildup noted in ears bilaterally. Nasal congestion.  Eyes: Conjunctivae and EOM are normal. Pupils are equal, round, and reactive to light. Right eye exhibits no discharge. Left eye exhibits no discharge.  Neck: Neck supple. No thyromegaly present.  Cardiovascular: Normal rate, regular rhythm and normal heart sounds.   Pulmonary/Chest: Effort normal and breath sounds normal. No respiratory distress.  Musculoskeletal: Normal range of motion.  Lymphadenopathy:       Head (right side): No tonsillar adenopathy present.       Head (left side): No tonsillar adenopathy present.    She has no cervical adenopathy.  Neurological: She is alert and oriented to person, place, and time.  Skin: Skin is warm and dry.  Psychiatric: She has a normal mood and affect. Her behavior is normal.     Assessment & Plan:  10:04 AM-Discussed my suspicion of a viral infection.and my recommendation for steroid injection and decongestants to help with congestion and cough syrup to help w/ sleep. Given snap rx with an antibiotic to fill if symptoms worsen. Choose levaquin due to pcn allergy. Treatment plan discussed with patient and patient agrees.  Suspect eye tearing is viral/allergy related but if worsening, ok to call and will call in antibiotic eye gtts. Sinusitis - Plan: methylPREDNISolone acetate (DEPO-MEDROL) injection 80 mg  Meds ordered this encounter  Medications  . methylPREDNISolone acetate (DEPO-MEDROL) injection 80 mg    Sig:   . levofloxacin (LEVAQUIN) 500 MG tablet    Sig: Take 1 tablet (500 mg total) by mouth daily.     Dispense:  7 tablet    Refill:  0  . chlorpheniramine-HYDROcodone (TUSSIONEX PENNKINETIC ER) 10-8 MG/5ML LQCR    Sig: Take 5 mLs by mouth every 12 (twelve) hours as needed.    Dispense:  120 mL    Refill:  0  . pseudoephedrine (SUDAFED 12 HOUR) 120 MG 12 hr tablet    Sig: Take 1 tablet (120 mg total) by mouth 2 (two) times daily.    Dispense:  30 tablet    Refill:  0  . fluticasone (FLONASE) 50 MCG/ACT nasal spray    Sig: Place 2 sprays into both nostrils at bedtime.    Dispense:  16 g    Refill:  2  . ipratropium (ATROVENT) 0.03 % nasal spray    Sig: Place 2 sprays into the nose 4 (four) times daily.    Dispense:  30 mL    Refill:  1    I personally performed the  services described in this documentation, which was scribed in my presence. The recorded information has been reviewed and considered, and addended by me as needed.  Delman Cheadle, MD MPH

## 2013-04-24 NOTE — Patient Instructions (Signed)
Hot showers or breathing in steam may help loosen the congestion.  Using a netti pot or sinus rinse is also likely to help you feel better and keep this from progressing.  Use the atrovent nasal spray as needed throughout the day and use the fluticasone nasal spray every night before bed for at least 2 weeks.  I recommend augmenting with 12 hr sudafed (behind the counter) and generic mucinex to help you move out the congestion.  If no improvement or you are getting worse, come back as you might need a course of steroids but hopefully with all of the above, you can avoid it. Sinusitis Sinusitis is redness, soreness, and swelling (inflammation) of the paranasal sinuses. Paranasal sinuses are air pockets within the bones of your face (beneath the eyes, the middle of the forehead, or above the eyes). In healthy paranasal sinuses, mucus is able to drain out, and air is able to circulate through them by way of your nose. However, when your paranasal sinuses are inflamed, mucus and air can become trapped. This can allow bacteria and other germs to grow and cause infection. Sinusitis can develop quickly and last only a short time (acute) or continue over a long period (chronic). Sinusitis that lasts for more than 12 weeks is considered chronic.  CAUSES  Causes of sinusitis include:  Allergies.  Structural abnormalities, such as displacement of the cartilage that separates your nostrils (deviated septum), which can decrease the air flow through your nose and sinuses and affect sinus drainage.  Functional abnormalities, such as when the small hairs (cilia) that line your sinuses and help remove mucus do not work properly or are not present. SYMPTOMS  Symptoms of acute and chronic sinusitis are the same. The primary symptoms are pain and pressure around the affected sinuses. Other symptoms include:  Upper toothache.  Earache.  Headache.  Bad breath.  Decreased sense of smell and taste.  A cough, which  worsens when you are lying flat.  Fatigue.  Fever.  Thick drainage from your nose, which often is green and may contain pus (purulent).  Swelling and warmth over the affected sinuses. DIAGNOSIS  Your caregiver will perform a physical exam. During the exam, your caregiver may:  Look in your nose for signs of abnormal growths in your nostrils (nasal polyps).  Tap over the affected sinus to check for signs of infection.  View the inside of your sinuses (endoscopy) with a special imaging device with a light attached (endoscope), which is inserted into your sinuses. If your caregiver suspects that you have chronic sinusitis, one or more of the following tests may be recommended:  Allergy tests.  Nasal culture A sample of mucus is taken from your nose and sent to a lab and screened for bacteria.  Nasal cytology A sample of mucus is taken from your nose and examined by your caregiver to determine if your sinusitis is related to an allergy. TREATMENT  Most cases of acute sinusitis are related to a viral infection and will resolve on their own within 10 days. Sometimes medicines are prescribed to help relieve symptoms (pain medicine, decongestants, nasal steroid sprays, or saline sprays).  However, for sinusitis related to a bacterial infection, your caregiver will prescribe antibiotic medicines. These are medicines that will help kill the bacteria causing the infection.  Rarely, sinusitis is caused by a fungal infection. In theses cases, your caregiver will prescribe antifungal medicine. For some cases of chronic sinusitis, surgery is needed. Generally, these are cases in   which sinusitis recurs more than 3 times per year, despite other treatments. HOME CARE INSTRUCTIONS   Drink plenty of water. Water helps thin the mucus so your sinuses can drain more easily.  Use a humidifier.  Inhale steam 3 to 4 times a day (for example, sit in the bathroom with the shower running).  Apply a warm,  moist washcloth to your face 3 to 4 times a day, or as directed by your caregiver.  Use saline nasal sprays to help moisten and clean your sinuses.  Take over-the-counter or prescription medicines for pain, discomfort, or fever only as directed by your caregiver. SEEK IMMEDIATE MEDICAL CARE IF:  You have increasing pain or severe headaches.  You have nausea, vomiting, or drowsiness.  You have swelling around your face.  You have vision problems.  You have a stiff neck.  You have difficulty breathing. MAKE SURE YOU:   Understand these instructions.  Will watch your condition.  Will get help right away if you are not doing well or get worse. Document Released: 03/17/2005 Document Revised: 06/09/2011 Document Reviewed: 04/01/2011 Exit[employer] Patient Information 2014 [employer].  

## 2013-08-09 ENCOUNTER — Other Ambulatory Visit: Payer: Self-pay | Admitting: Obstetrics and Gynecology

## 2014-08-01 ENCOUNTER — Other Ambulatory Visit: Payer: Self-pay | Admitting: Obstetrics and Gynecology

## 2014-08-01 DIAGNOSIS — R928 Other abnormal and inconclusive findings on diagnostic imaging of breast: Secondary | ICD-10-CM

## 2014-08-17 ENCOUNTER — Ambulatory Visit
Admission: RE | Admit: 2014-08-17 | Discharge: 2014-08-17 | Disposition: A | Discharge status: Home / Self Care | Payer: BC Managed Care – PPO | Source: Ambulatory Visit | Attending: Obstetrics and Gynecology | Admitting: Obstetrics and Gynecology

## 2014-08-17 DIAGNOSIS — R928 Other abnormal and inconclusive findings on diagnostic imaging of breast: Secondary | ICD-10-CM

## 2015-10-17 ENCOUNTER — Encounter (HOSPITAL_COMMUNITY): Payer: Self-pay | Admitting: Emergency Medicine

## 2015-10-17 ENCOUNTER — Ambulatory Visit (HOSPITAL_COMMUNITY)
Admission: EM | Admit: 2015-10-17 | Discharge: 2015-10-17 | Disposition: A | Discharge status: Home / Self Care | Payer: 59 | Attending: Emergency Medicine | Admitting: Emergency Medicine

## 2015-10-17 DIAGNOSIS — R030 Elevated blood-pressure reading, without diagnosis of hypertension: Secondary | ICD-10-CM | POA: Diagnosis not present

## 2015-10-17 DIAGNOSIS — IMO0001 Reserved for inherently not codable concepts without codable children: Secondary | ICD-10-CM

## 2015-10-17 LAB — POCT I-STAT, CHEM 8
BUN: 12 mg/dL (ref 6–20)
CALCIUM ION: 1.16 mmol/L (ref 1.13–1.30)
CHLORIDE: 101 mmol/L (ref 101–111)
Creatinine, Ser: 1 mg/dL (ref 0.44–1.00)
GLUCOSE: 109 mg/dL — AB (ref 65–99)
HCT: 44 % (ref 36.0–46.0)
Hemoglobin: 15 g/dL (ref 12.0–15.0)
POTASSIUM: 3.9 mmol/L (ref 3.5–5.1)
Sodium: 139 mmol/L (ref 135–145)
TCO2: 28 mmol/L (ref 0–100)

## 2015-10-17 MED ORDER — HYDROCHLOROTHIAZIDE 12.5 MG PO TABS: 12.5000 mg | ORAL_TABLET | Freq: Every day | ORAL | Status: DC

## 2015-10-17 NOTE — ED Notes (Signed)
The patient presented to the [facility] with a complaint of HTN that has started over the last 3 days.

## 2015-10-17 NOTE — ED Provider Notes (Signed)
CSN: [id number]     Arrival date & time 10/17/15  1847 History   First MD Initiated Contact with Patient 10/17/15 2013     Chief Complaint  Patient presents with  . Hypertension   (Consider location/radiation/quality/duration/timing/severity/associated sxs/prior Treatment) HPI She is a 45 year old woman here for evaluation of hypertension. She denies any history of hypertension, but states over the last several years she has had intermittent spikes. For the last 3 days she has had blood pressures persistently greater than 140/90.  These are associated with mild headaches. She has tried drinking water to bring her blood pressure down without success. She denies any chest pain or shortness of breath. No vision changes. No significant leg swelling. No recent change in medications or diet. About 3 months ago she did switch roles at her job and has a much more sedentary job than she used to. Hypertension does run in her family.  Past Medical History  Diagnosis Date  . Anxiety   . GERD (gastroesophageal reflux disease)     uses papaya seeds for heartburn  . Sleep apnea     uses a CPAP   Past Surgical History  Procedure Laterality Date  . Tubal ligation    . Abdominal hysterectomy Bilateral 07/19/2012    Procedure: HYSTERECTOMY ABDOMINAL WITH BILATERAL SALPINGECTOMY AND LYSIS OF ADHESIONS;  Surgeon: Olga Millers, MD;  Location: [facility];  Service: Gynecology;  Laterality: Bilateral;   Family History  Problem Relation Age of Onset  . Hypertension Mother   . Hypertension Father    Social History  Substance Use Topics  . Smoking status: Never Smoker   . Smokeless tobacco: None  . Alcohol Use: No   OB History    No data available     Review of Systems As in history of present illness Allergies  Penicillins and Latex  Home Medications   Prior to Admission medications   Medication Sig Start Date End Date Taking? Authorizing Provider  LORazepam (ATIVAN) 0.5 MG tablet Take 0.5 mg by  mouth as needed for anxiety.    Yes Historical Provider, MD  hydrochlorothiazide (HYDRODIURIL) 12.5 MG tablet Take 1 tablet (12.5 mg total) by mouth daily. 10/17/15   Melony Overly, MD   Meds Ordered and Administered this Visit  Medications - No data to display  BP 165/102 mmHg  Pulse 87  Temp(Src) 98.2 F (36.8 C) (Oral)  Resp 18  SpO2 100%  LMP 04/24/2013 No data found.   Physical Exam  Constitutional: She is oriented to person, place, and time. She appears well-developed and well-nourished. No distress.  Neck: Neck supple.  Cardiovascular: Normal rate, regular rhythm and normal heart sounds.   No murmur heard. Pulmonary/Chest: Effort normal and breath sounds normal. No respiratory distress. She has no wheezes. She has no rales.  Musculoskeletal:  Trace bilateral edema in the ankles  Neurological: She is alert and oriented to person, place, and time.    ED Course  Procedures (including critical care time)  Labs Review Labs Reviewed  POCT I-STAT, CHEM 8 - Abnormal; Notable for the following:    Glucose, Bld 109 (*)    All other components within normal limits    Imaging Review No results found.   MDM   1. Elevated blood pressure    We'll go ahead and start hydrochlorothiazide 12.5 mg daily. She will follow-up with her PCP next week for recheck and medication adjustment. Discussed that if she can increase activity levels, she may be able to  come off the medicine.    Melony Overly, MD 10/17/15 2107

## 2015-10-17 NOTE — Discharge Instructions (Signed)
Your blood pressure is elevated. Your blood work looks good. I'm going to start you on a medicine called hydrochlorothiazide. Take 1 pill once a day. You can start this tomorrow. You may have a little bit of increased urination for the first few days, but this typically resolves. If you can find other ways to be more active, you may not need this medication long-term. Please follow-up with your primary care doctor in 1 week for a recheck.

## 2015-10-22 ENCOUNTER — Emergency Department (HOSPITAL_BASED_OUTPATIENT_CLINIC_OR_DEPARTMENT_OTHER): Payer: 59

## 2015-10-22 ENCOUNTER — Emergency Department (HOSPITAL_BASED_OUTPATIENT_CLINIC_OR_DEPARTMENT_OTHER)
Admission: EM | Admit: 2015-10-22 | Discharge: 2015-10-22 | Disposition: A | Discharge status: Home / Self Care | Payer: 59 | Attending: Emergency Medicine | Admitting: Emergency Medicine

## 2015-10-22 ENCOUNTER — Encounter (HOSPITAL_BASED_OUTPATIENT_CLINIC_OR_DEPARTMENT_OTHER): Payer: Self-pay | Admitting: Emergency Medicine

## 2015-10-22 DIAGNOSIS — X58XXXA Exposure to other specified factors, initial encounter: Secondary | ICD-10-CM | POA: Diagnosis not present

## 2015-10-22 DIAGNOSIS — Y999 Unspecified external cause status: Secondary | ICD-10-CM | POA: Diagnosis not present

## 2015-10-22 DIAGNOSIS — Y939 Activity, unspecified: Secondary | ICD-10-CM | POA: Insufficient documentation

## 2015-10-22 DIAGNOSIS — Y929 Unspecified place or not applicable: Secondary | ICD-10-CM | POA: Diagnosis not present

## 2015-10-22 DIAGNOSIS — S199XXA Unspecified injury of neck, initial encounter: Secondary | ICD-10-CM | POA: Diagnosis present

## 2015-10-22 DIAGNOSIS — S161XXA Strain of muscle, fascia and tendon at neck level, initial encounter: Secondary | ICD-10-CM | POA: Diagnosis not present

## 2015-10-22 MED ORDER — NAPROXEN 250 MG PO TABS
500.0000 mg | ORAL_TABLET | Freq: Once | ORAL | Status: AC
  Administered 2015-10-22: 500 mg via ORAL
  Filled 2015-10-22: qty 2

## 2015-10-22 MED ORDER — METHOCARBAMOL 500 MG PO TABS: 500.0000 mg | ORAL_TABLET | Freq: Two times a day (BID) | ORAL | 0 refills | Status: DC | PRN

## 2015-10-22 MED ORDER — METHOCARBAMOL 500 MG PO TABS
500.0000 mg | ORAL_TABLET | Freq: Once | ORAL | Status: AC
  Administered 2015-10-22: 500 mg via ORAL
  Filled 2015-10-22: qty 1

## 2015-10-22 MED ORDER — NAPROXEN 500 MG PO TABS: 500.0000 mg | ORAL_TABLET | Freq: Two times a day (BID) | ORAL | 0 refills | Status: DC | PRN

## 2015-10-22 NOTE — ED Notes (Signed)
Patient transported to x-ray. ?

## 2015-10-22 NOTE — Discharge Instructions (Signed)
Take naproxen as needed for pain. Robaxin is your muscle relaxer- This can make you very drowsy - please do not drink alcohol, operate heavy machinery or drive on this medication. You can also use ice or heat for additional pain relief. Keep your scheduled appointment with her primary care physician on Wednesday for recheck of symptoms. Continue to take your blood pressure medication daily as directed. Return to ER for any new or worsening symptoms, any additional concerns.

## 2015-10-22 NOTE — ED Provider Notes (Signed)
[facility] Provider Note   CSN: [id number] Arrival date & time: 10/22/15  1734  First Provider Contact:  10/22/2015 5:56 PM   By signing my name below, I, Andrea Bruce, attest that this documentation has been prepared under the direction and in the presence of non-physician practitioner, Pearlie Oyster, PA-C. Electronically Signed: Higinio Bruce, Scribe. 10/22/2015. 6:04 PM.  History   Chief Complaint Chief Complaint  Patient presents with  . Neck Pain   HPI Comments: Andrea Bruce is a 45 y.o. female with PMHx of HTN, who presents to the Emergency Department complaining of gradually worsening, radiating, 7/10, right sided neck pain that began a few days ago and worsened today. Pt reports her neck pain radiates up towards her head causing moderate headaches. Pt also notes associated mild, intermittent, dizziness that she describes as "feeling a little off" but not room-spinning or tunnel visioned. She states her pain is exacerbated with movement on the right side of her body but is relieved when lying down. She states she took 3 ibuprofen at noon this afternoon with mild relief. Pt reports she visited Urgent Care on 7/19 for similar symptoms in which she was told her pain may be a result of her high blood pressure - she was started on BP meds, but pain persisted; pt's BP is currently 128/90 in the ED. Pt notes she did not have an X-ray of her neck at Urgent Care. She denies any recent injury to her neck; she reports she sits at her desk at work for the majority of her day.  The history is provided by the patient. No language interpreter was used.   Past Medical History:  Diagnosis Date  . Anxiety   . GERD (gastroesophageal reflux disease)    uses papaya seeds for heartburn  . Sleep apnea    uses a CPAP    There are no active problems to display for this patient.   Past Surgical History:  Procedure Laterality Date  . ABDOMINAL HYSTERECTOMY Bilateral 07/19/2012   Procedure:  HYSTERECTOMY ABDOMINAL WITH BILATERAL SALPINGECTOMY AND LYSIS OF ADHESIONS;  Surgeon: Olga Millers, MD;  Location: [facility];  Service: Gynecology;  Laterality: Bilateral;  . TUBAL LIGATION      OB History    No data available       Home Medications    Prior to Admission medications   Medication Sig Start Date End Date Taking? Authorizing Provider  hydrochlorothiazide (HYDRODIURIL) 12.5 MG tablet Take 1 tablet (12.5 mg total) by mouth daily. 10/17/15   Melony Overly, MD  LORazepam (ATIVAN) 0.5 MG tablet Take 0.5 mg by mouth as needed for anxiety.     Historical Provider, MD  methocarbamol (ROBAXIN) 500 MG tablet Take 1 tablet (500 mg total) by mouth 2 (two) times daily as needed for muscle spasms. 10/22/15   Ozella Almond Jae Bruck, PA-C  naproxen (NAPROSYN) 500 MG tablet Take 1 tablet (500 mg total) by mouth 2 (two) times daily as needed. 10/22/15   [place], PA-C    Family History Family History  Problem Relation Age of Onset  . Hypertension Mother   . Hypertension Father     Social History Social History  Substance Use Topics  . Smoking status: Never Smoker  . Smokeless tobacco: Never Used  . Alcohol use No     Allergies   Penicillins and Latex   Review of Systems Review of Systems  Musculoskeletal: Positive for neck pain.  Skin: Negative for color change.  Neurological: Positive for dizziness and headaches. Negative for syncope and numbness.   Physical Exam Updated Vital Signs BP 131/98 (BP Location: Right Arm)   Pulse 115   Temp 98.3 F (36.8 C) (Oral)   Resp 18   Ht 5\' 2"  (1.575 m)   Wt 179 lb (81.2 kg)   LMP 04/24/2013   SpO2 100%   BMI 32.74 kg/m   Physical Exam  Constitutional: She is oriented to person, place, and time. She appears well-developed and well-nourished.  HENT:  Head: Normocephalic and atraumatic.  Right Ear: External ear normal.  Mouth/Throat: Oropharynx is clear and moist.  Eyes: Conjunctivae are normal. Pupils are equal,  round, and reactive to light.  Neck: Neck supple. No tracheal deviation present.  TTP of right paraspinal musculature  No midline tenderness  Decreased ROM when pt turns head to the right, otherwise normal ROM  No meningeal signs   Cardiovascular: Normal rate, regular rhythm and normal heart sounds.   No murmur heard. Pulmonary/Chest: Effort normal and breath sounds normal. No stridor. No respiratory distress.  Neurological: She is alert and oriented to person, place, and time.  Psychiatric: She has a normal mood and affect. Her behavior is normal. Judgment and thought content normal.  Nursing note and vitals reviewed.  ED Treatments / Results  Labs (all labs ordered are listed, but only abnormal results are displayed) Labs Reviewed - No data to display  EKG  EKG Interpretation None       Radiology Dg Cervical Spine Complete  Result Date: 10/22/2015 CLINICAL DATA:  Right-sided neck pain for the past few days. No known injury. EXAM: CERVICAL SPINE - COMPLETE 4+ VIEW COMPARISON:  None. FINDINGS: Normal wall overall alignment of the cervical vertebral bodies. Mild degenerative disc disease at C4-5 and C5-6. No acute bony findings or abnormal prevertebral soft tissue swelling. Calcified density located just posterior to C6 could be an old avulsion fracture or dystrophic calcification in the nuchal ligament. The oblique films demonstrate normally aligned articular process and no bony foraminal stenosis. The C1-2 articulations are maintained. The lung apices are clear. IMPRESSION: Normal alignment and no acute bony findings. Mild degenerative disc disease at C4-5 and C5-6. Electronically Signed   By: Marijo Sanes M.D.   On: 10/22/2015 18:36  Procedures Procedures  DIAGNOSTIC STUDIES:  Oxygen Saturation is 100% on RA, normal by my interpretation.    COORDINATION OF CARE:  5:53 PM Discussed treatment Bruce, which includes X-ray of neck with pt at bedside and pt agreed to  Bruce.   Medications Ordered in ED Medications  methocarbamol (ROBAXIN) tablet 500 mg (500 mg Oral Given 10/22/15 1813)  naproxen (NAPROSYN) tablet 500 mg (500 mg Oral Given 10/22/15 1813)   Initial Impression / Assessment and Bruce / ED Course  I have reviewed the triage vital signs and the nursing notes.  Pertinent labs & imaging results that were available during my care of the patient were reviewed by me and considered in my medical decision making (see chart for details).  Clinical Course   BAILEIGH HOHEISEL presents to ED for right-sided neck pain. On exam, patient with tenderness to palpation over SCM and trap. No midline bony tenderness. Informed patient that I did not believe imaging was necessary at this time as she had no tenderness to palpation mildline, however patient states she would feel better if imaging was done. DG cervical spine obtained with normal alignment and no acute bony findings. She was given Robaxin and naproxen. Upon reevaluation,  she states her symptoms are improved. She has an appointment with her primary care physician on Wednesday. Will give Rx for Robaxin and naproxen and discussed symptomatic home care instructions. PCP to recheck neck had already scheduled follow-up. Return precautions discussed and all questions answered.  Final Clinical Impressions(s) / ED Diagnoses   Final diagnoses:  Cervical strain, acute, initial encounter    New Prescriptions Discharge Medication List as of 10/22/2015  7:35 PM    START taking these medications   Details  methocarbamol (ROBAXIN) 500 MG tablet Take 1 tablet (500 mg total) by mouth 2 (two) times daily as needed for muscle spasms., Starting Mon 10/22/2015, Print    naproxen (NAPROSYN) 500 MG tablet Take 1 tablet (500 mg total) by mouth 2 (two) times daily as needed., Starting Mon 10/22/2015, Print       I personally performed the services described in this documentation, which was scribed in my presence. The recorded  information has been reviewed and is accurate.     [facility] Kanya Potteiger, PA-C 10/22/15 2002    Charlesetta Shanks, MD 10/23/15 1700

## 2015-10-22 NOTE — ED Notes (Signed)
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

## 2016-02-08 ENCOUNTER — Encounter (HOSPITAL_COMMUNITY): Payer: Self-pay | Admitting: Nurse Practitioner

## 2016-02-08 ENCOUNTER — Emergency Department (HOSPITAL_COMMUNITY)
Admission: EM | Admit: 2016-02-08 | Discharge: 2016-02-08 | Disposition: A | Discharge status: Home / Self Care | Payer: 59 | Attending: Emergency Medicine | Admitting: Emergency Medicine

## 2016-02-08 DIAGNOSIS — Z79899 Other long term (current) drug therapy: Secondary | ICD-10-CM | POA: Insufficient documentation

## 2016-02-08 DIAGNOSIS — F419 Anxiety disorder, unspecified: Secondary | ICD-10-CM | POA: Insufficient documentation

## 2016-02-08 DIAGNOSIS — Z91040 Latex allergy status: Secondary | ICD-10-CM | POA: Insufficient documentation

## 2016-02-08 NOTE — Discharge Instructions (Signed)
Please read attached information. If you experience any new or worsening signs or symptoms please return to the emergency room for evaluation. Please follow-up with your primary care provider or specialist as discussed.  °

## 2016-02-08 NOTE — ED Triage Notes (Signed)
Patient was at work and started feeling bad and took a muscle relaxer and lorazepam and inhaler bc she felt SOB EMS meet her at home and has a history of anxiety and wants to be evaluated.

## 2016-02-08 NOTE — ED Provider Notes (Signed)
[facility] DEPT Provider Note   CSN: [id number] Arrival date & time: 02/08/16  1122   By signing my name below, I, Andrea Bruce, attest that this documentation has been prepared under the direction and in the presence of [employer], PA-C. Electronically Signed: Neta Bruce, ED Scribe. 02/08/2016. 12:44 PM.   History   Chief Complaint No chief complaint on file.   The history is provided by the patient. No language interpreter was used.   HPI Comments:  Andrea Bruce is a 45 y.o. female with PMHx of HTN and anxiety who presents to the Emergency Department via EMS complaining of gradually improving anxiety symptoms that began earlier today. Pt states that she was at work and her face and eyes began to feel swollen. Pt then ate and drank fluids which provided no relief, so she took lorazepam because she thought she was having an anxiety attack, and then took her allergy medication on her way home. Pt states that she took her HTN medication before work. Pt complains of associated dizziness, and nausea and SOB that have since resolved. Pt reports that she was hypertensive when measured by EMS. Pt notes that her symptoms are similar to those associated with her previous anxiety attacks. Pt states that her symptoms have been improving since onset. Pt reports that the medication she took after noticing her symptoms did provide moderate relief. Pt denies chest pain and leg swelling.   Past Medical History:  Diagnosis Date  . Anxiety   . GERD (gastroesophageal reflux disease)    uses papaya seeds for heartburn  . Sleep apnea    uses a CPAP    There are no active problems to display for this patient.   Past Surgical History:  Procedure Laterality Date  . ABDOMINAL HYSTERECTOMY Bilateral 07/19/2012   Procedure: HYSTERECTOMY ABDOMINAL WITH BILATERAL SALPINGECTOMY AND LYSIS OF ADHESIONS;  Surgeon: Olga Millers, MD;  Location: [facility];  Service: Gynecology;  Laterality:  Bilateral;  . TUBAL LIGATION      OB History    No data available      Home Medications    Prior to Admission medications   Medication Sig Start Date End Date Taking? Authorizing Provider  hydrochlorothiazide (HYDRODIURIL) 12.5 MG tablet Take 1 tablet (12.5 mg total) by mouth daily. 10/17/15   Melony Overly, MD  LORazepam (ATIVAN) 0.5 MG tablet Take 0.5 mg by mouth as needed for anxiety.     Historical Provider, MD  methocarbamol (ROBAXIN) 500 MG tablet Take 1 tablet (500 mg total) by mouth 2 (two) times daily as needed for muscle spasms. 10/22/15   Ozella Almond [place], PA-C  naproxen (NAPROSYN) 500 MG tablet Take 1 tablet (500 mg total) by mouth 2 (two) times daily as needed. 10/22/15   [place], PA-C    Family History Family History  Problem Relation Age of Onset  . Hypertension Mother   . Hypertension Father     Social History Social History  Substance Use Topics  . Smoking status: Never Smoker  . Smokeless tobacco: Never Used  . Alcohol use No     Allergies   Penicillins and Latex   Review of Systems Review of Systems 10 systems reviewed and all are negative for acute change except as noted in the HPI.    Physical Exam Updated Vital Signs BP 135/78   Pulse 100   Resp 18   Ht 5\' 1"  (1.549 m)   Wt 87.1 kg   LMP  04/24/2013   BMI 36.28 kg/m   Physical Exam  Constitutional: She appears well-developed and well-nourished. No distress.  HENT:  Head: Normocephalic and atraumatic.  Mouth/Throat: Oropharynx is clear and moist. No oropharyngeal exudate.  Eyes: Conjunctivae are normal. Right eye exhibits no discharge. Left eye exhibits no discharge.  Cardiovascular: Normal rate, regular rhythm and normal heart sounds.   Pulmonary/Chest: Effort normal. No respiratory distress. She has no wheezes. She has no rales. She exhibits no tenderness.  Abdominal: She exhibits no distension.  Neurological: She is alert.  Skin: Skin is warm and dry.  Psychiatric: She  has a normal mood and affect.  Nursing note and vitals reviewed.    ED Treatments / Results  DIAGNOSTIC STUDIES:  Oxygen Saturation is 100% on RA, normal by my interpretation.    COORDINATION OF CARE:  12:44 PM Discussed treatment plan with pt at bedside and pt agreed to plan.   Labs (all labs ordered are listed, but only abnormal results are displayed) Labs Reviewed - No data to display  EKG  EKG Interpretation None       Radiology No results found.  Procedures Procedures (including critical care time)  Medications Ordered in ED Medications - No data to display   Initial Impression / Assessment and Plan / ED Course  I have reviewed the triage vital signs and the nursing notes.  Pertinent labs & imaging results that were available during my care of the patient were reviewed by me and considered in my medical decision making (see chart for details).  Clinical Course      Final Clinical Impressions(s) / ED Diagnoses   Final diagnoses:  Anxiety   Labs:   Imaging:   Consults:   Therapeutics:   Discharge Meds:  Assessment/Plan: Pt presents today with likely anxiety attack. Pt asymptomatic at the time of my evaluation. She has no concerning sign or symptoms for underlying structural Cause. Patient instructed to rest, use antianxiety medication as needed, return to the emergency room if she cases any new lesions on symptoms. She verbalized understanding and agreement to this.   Recheck of vitals show her pulse at 80 prior to discharge   New Prescriptions Discharge Medication List as of 02/08/2016 12:52 PM      I personally performed the services described in this documentation, which was scribed in my presence. The recorded information has been reviewed and is accurate.    Okey Regal, PA-C 02/08/16 1620    Okey Regal, PA-C 02/08/16 1620    Tanna Furry, MD 02/27/16 1018

## 2016-05-17 ENCOUNTER — Encounter (HOSPITAL_COMMUNITY): Payer: Self-pay | Admitting: Emergency Medicine

## 2016-05-17 ENCOUNTER — Emergency Department (HOSPITAL_COMMUNITY)
Admission: EM | Admit: 2016-05-17 | Discharge: 2016-05-18 | Disposition: A | Discharge status: Home / Self Care | Payer: 59 | Attending: Emergency Medicine | Admitting: Emergency Medicine

## 2016-05-17 ENCOUNTER — Emergency Department (HOSPITAL_COMMUNITY): Payer: 59

## 2016-05-17 DIAGNOSIS — R0789 Other chest pain: Secondary | ICD-10-CM | POA: Diagnosis not present

## 2016-05-17 DIAGNOSIS — R51 Headache: Secondary | ICD-10-CM | POA: Insufficient documentation

## 2016-05-17 DIAGNOSIS — I10 Essential (primary) hypertension: Secondary | ICD-10-CM | POA: Diagnosis not present

## 2016-05-17 DIAGNOSIS — R079 Chest pain, unspecified: Secondary | ICD-10-CM | POA: Diagnosis present

## 2016-05-17 DIAGNOSIS — Z91040 Latex allergy status: Secondary | ICD-10-CM | POA: Diagnosis not present

## 2016-05-17 DIAGNOSIS — Z79899 Other long term (current) drug therapy: Secondary | ICD-10-CM | POA: Diagnosis not present

## 2016-05-17 DIAGNOSIS — R030 Elevated blood-pressure reading, without diagnosis of hypertension: Secondary | ICD-10-CM

## 2016-05-17 DIAGNOSIS — R519 Headache, unspecified: Secondary | ICD-10-CM

## 2016-05-17 HISTORY — DX: Essential (primary) hypertension: I10

## 2016-05-17 LAB — I-STAT TROPONIN, ED
TROPONIN I, POC: 0 ng/mL (ref 0.00–0.08)
Troponin i, poc: 0.01 ng/mL (ref 0.00–0.08)

## 2016-05-17 LAB — BASIC METABOLIC PANEL
ANION GAP: 14 (ref 5–15)
BUN: 14 mg/dL (ref 6–20)
CO2: 22 mmol/L (ref 22–32)
Calcium: 9.8 mg/dL (ref 8.9–10.3)
Chloride: 100 mmol/L — ABNORMAL LOW (ref 101–111)
Creatinine, Ser: 0.89 mg/dL (ref 0.44–1.00)
GFR calc Af Amer: >60 mL/min (ref >60)
GLUCOSE: 120 mg/dL — AB (ref 65–99)
Potassium: 3.2 mmol/L — ABNORMAL LOW (ref 3.5–5.1)
Sodium: 136 mmol/L (ref 135–145)

## 2016-05-17 LAB — CBC
HCT: 40 % (ref 36.0–46.0)
HEMOGLOBIN: 14.3 g/dL (ref 12.0–15.0)
MCH: 30.6 pg (ref 26.0–34.0)
MCHC: 35.8 g/dL (ref 30.0–36.0)
MCV: 85.7 fL (ref 78.0–100.0)
PLATELETS: 322 10*3/uL (ref 150–400)
RBC: 4.67 MIL/uL (ref 3.87–5.11)
RDW: 12.7 % (ref 11.5–15.5)
WBC: 6.8 10*3/uL (ref 4.0–10.5)

## 2016-05-17 NOTE — ED Notes (Signed)
Pt understood dc material. NAD noted. 

## 2016-05-17 NOTE — ED Provider Notes (Signed)
[facility] DEPT Provider Note   CSN: [id number] Arrival date & time: 05/17/16  1938     History   Chief Complaint Chief Complaint  Patient presents with  . Hypertension  . Chest Pain  . Headache    HPI Andrea Bruce is a 46 y.o. female with history of HTN presents today with high blood pressure readings at home and [employer]. She reports the blood pressure being about 165/110. Patient reports associated dull ache in ear, neck, and back. She states having an achy chest pain feeling when walking in the ED which she states did not have before and states she no longer has now. She states the pain is intermittent and presents when she turns her head and just feels "crampy all over" 6/10. She reports associated headache that comes and goes, and similar to her previous headaches. She reports not trying anything for the symptoms. She thought it may be musculoskeletal. Pt denies fevers, chills, nausea, vomiting, shortness of breath, night sweats, numbness or tingling, changes in vision, photophobia, gait.   The history is provided by the patient. No language interpreter was used.    Past Medical History:  Diagnosis Date  . Anxiety   . GERD (gastroesophageal reflux disease)    uses papaya seeds for heartburn  . Hypertension   . Sleep apnea    uses a CPAP    There are no active problems to display for this patient.   Past Surgical History:  Procedure Laterality Date  . ABDOMINAL HYSTERECTOMY Bilateral 07/19/2012   Procedure: HYSTERECTOMY ABDOMINAL WITH BILATERAL SALPINGECTOMY AND LYSIS OF ADHESIONS;  Surgeon: Olga Millers, MD;  Location: [facility];  Service: Gynecology;  Laterality: Bilateral;  . TUBAL LIGATION      OB History    No data available       Home Medications    Prior to Admission medications   Medication Sig Start Date End Date Taking? Authorizing Provider  albuterol (PROVENTIL HFA;VENTOLIN HFA) 108 (90 Base) MCG/ACT inhaler Inhale 1-2 puffs into the lungs  every 6 (six) hours as needed for wheezing or shortness of breath.   Yes Historical Provider, MD  Fluticasone Furoate (ARNUITY ELLIPTA) 100 MCG/ACT AEPB Inhale 1 puff into the lungs daily.   Yes Historical Provider, MD  hydrochlorothiazide (HYDRODIURIL) 12.5 MG tablet Take 1 tablet (12.5 mg total) by mouth daily. 10/17/15  Yes Melony Overly, MD  LORazepam (ATIVAN) 0.5 MG tablet Take 0.5 mg by mouth as needed for anxiety.    Yes Historical Provider, MD  methocarbamol (ROBAXIN) 500 MG tablet Take 1 tablet (500 mg total) by mouth 2 (two) times daily as needed for muscle spasms. 10/22/15  Yes Jaime Pilcher Ward, PA-C  naproxen (NAPROSYN) 500 MG tablet Take 1 tablet (500 mg total) by mouth 2 (two) times daily as needed. Patient taking differently: Take 500 mg by mouth 2 (two) times daily as needed for mild pain.  10/22/15  Yes Ozella Almond Ward, PA-C    Family History Family History  Problem Relation Age of Onset  . Hypertension Mother   . Hypertension Father     Social History Social History  Substance Use Topics  . Smoking status: Never Smoker  . Smokeless tobacco: Never Used  . Alcohol use No     Allergies   Penicillins and Latex   Review of Systems Review of Systems  Constitutional: Negative for chills and fever.  Eyes: Negative for visual disturbance.  Respiratory: Negative for shortness of breath.   Cardiovascular:  Positive for chest pain (short episode earlier today in the waiting room. No longer has chest pain.).  Gastrointestinal: Negative for diarrhea, nausea and vomiting.  Genitourinary: Negative for difficulty urinating and dysuria.  Musculoskeletal: Negative for back pain, neck pain and neck stiffness.  Skin: Negative for rash and wound.  Neurological: Negative for numbness.  All other systems reviewed and are negative.    Physical Exam Updated Vital Signs BP 142/98   Pulse 89   Temp 98.3 F (36.8 C) (Oral)   Resp 20   LMP 04/24/2013   SpO2 99%   Physical  Exam  Constitutional: She is oriented to person, place, and time. She appears well-developed and well-nourished.  Well appearing  HENT:  Head: Normocephalic and atraumatic.  Nose: Nose normal.  Mouth/Throat: Oropharynx is clear and moist.  Eyes: Conjunctivae and EOM are normal. Pupils are equal, round, and reactive to light.  Neck: Normal range of motion.  Cardiovascular: Normal rate, normal heart sounds and intact distal pulses.   Pulmonary/Chest: Effort normal and breath sounds normal. No respiratory distress. She has no wheezes. She has no rales.  Normal work of breathing. No respiratory distress noted.   Abdominal: Soft. There is no tenderness. There is no rebound and no guarding.  Soft and nontender. No pulsatile mass noted.  Musculoskeletal: Normal range of motion.  Mild tenderness on left upper chest wall and trapezius muscle   Neurological: She is alert and oriented to person, place, and time.  Cranial Nerves:  III,IV, VI: ptosis not present, extra-ocular movements intact bilaterally, direct and consensual pupillary light reflexes intact bilaterally V: facial sensation, jaw opening, and bite strength equal bilaterally VII: eyebrow raise, eyelid close, smile, frown, pucker equal bilaterally VIII: hearing grossly normal bilaterally  IX,X: palate elevation and swallowing intact XI: bilateral shoulder shrug and lateral head rotation equal and strong XII: midline tongue extension  Negative pronator drift, negative Romberg, negative RAM's, negative heel-to-shin, negative finger to nose.    Sensory intact.  Muscle strength 5/5 Patient able to ambulate without difficulty.   Skin: Skin is warm.  Psychiatric: She has a normal mood and affect. Her behavior is normal.  Nursing note and vitals reviewed.    ED Treatments / Results  Labs (all labs ordered are listed, but only abnormal results are displayed) Labs Reviewed  BASIC METABOLIC PANEL - Abnormal; Notable for the following:         Result Value   Potassium 3.2 (*)    Chloride 100 (*)    Glucose, Bld 120 (*)    All other components within normal limits  CBC  I-STAT TROPOININ, ED  Randolm Idol, ED    EKG  EKG Interpretation None       Radiology Dg Chest 2 View  Result Date: 05/17/2016 CLINICAL DATA:  46 year old female with history of dull left-sided chest pain. Hypertension. Minor headaches. EXAM: CHEST  2 VIEW COMPARISON:  Chest x-ray a 03/22/2010. FINDINGS: Lung volumes are normal. No consolidative airspace disease. No pleural effusions. No pneumothorax. No pulmonary nodule or mass noted. Pulmonary vasculature and the cardiomediastinal silhouette are within normal limits. IMPRESSION: No radiographic evidence of acute cardiopulmonary disease. Electronically Signed   By: Vinnie Langton M.D.   On: 05/17/2016 20:48    Procedures Procedures (including critical care time)  Medications Ordered in ED Medications - No data to display   Initial Impression / Assessment and Plan / ED Course  I have reviewed the triage vital signs and the nursing notes.  Pertinent  labs & imaging results that were available during my care of the patient were reviewed by me and considered in my medical decision making (see chart for details).    Patient noted to be hypertensive in the emergency department.  No signs of hypertensive urgency.  Discussed with patient the need for close follow-up and management by their primary care physician.   Patient is to be discharged with recommendation to follow up with PCP in regards to today's hospital visit. Chest pain improved here and reproducible.  Chest pain is not likely of cardiac or pulmonary etiology d/t presentation, perc negative, VSS, no tracheal deviation, no JVD or new murmur, RRR, breath sounds equal bilaterally, EKG without acute abnormalities, negative troponin x 2, and negative CXR. Pt has been advised to return to the ED is CP becomes exertional, associated with  diaphoresis or nausea, radiates to left jaw/arm, worsens or becomes concerning in any way.  Pt HA  improved while in ED.  Presentation is like pts typical HA and non concerning for [facility], ICH, Meningitis, or temporal arteritis. Pt is afebrile with no focal neuro deficits, nuchal rigidity, or change in vision.  Pt in NAD, VSS, afebrile. Pt verbalizes understanding, pt appears reliable for follow up and is agreeable to discharge.    Final Clinical Impressions(s) / ED Diagnoses   Final diagnoses:  Elevated blood pressure reading  Nonspecific chest pain  Nonintractable headache, unspecified chronicity pattern, unspecified headache type    New Prescriptions New Prescriptions   No medications on file     [place], [place] 05/17/16 Gazelle Liu, MD 05/18/16 1328

## 2016-05-17 NOTE — ED Notes (Signed)
PA at the bedside.

## 2016-05-17 NOTE — Discharge Instructions (Signed)
Please follow up with your primary care provider in 2 days regarding today's visit. Please drink plenty of fluids throughout the day. Keep log of all of your blood pressure readings throughout the day to show your provider.   Get help right away if: You develop a severe headache or confusion. You have unusual weakness, numbness, or feel faint. You have severe chest or abdominal pain. You vomit repeatedly. You have trouble breathing. Get help right away if: Your chest pain is worse. You have a cough that gets worse, or you cough up blood. You have severe pain in your abdomen. You have severe weakness. You faint. You have sudden, unexplained chest discomfort. You have sudden, unexplained discomfort in your arms, back, neck, or jaw. You have shortness of breath at any time. You suddenly start to sweat, or your skin gets clammy. You feel nauseous or you vomit. You suddenly feel light-headed or dizzy. Your heart begins to beat quickly, or it feels like it is skipping beats.

## 2016-05-17 NOTE — ED Triage Notes (Signed)
Pt presents to ED after having several high readings of BP at home and at [facility].  Pt got 165/110 at home.  Diastolic still high at triage.  Pt c/o a dull ache to the left chest starting today.  Also c/o headache.  Denies any other associated symptoms at this time.

## 2016-05-17 NOTE — ED Notes (Signed)
Pt was informed of one more blood test and if that is ok the provider will DC her

## 2016-08-26 ENCOUNTER — Other Ambulatory Visit: Payer: Self-pay | Admitting: Obstetrics and Gynecology

## 2016-08-28 LAB — CYTOLOGY - PAP

## 2016-09-23 ENCOUNTER — Other Ambulatory Visit: Payer: Self-pay | Admitting: Obstetrics and Gynecology

## 2016-09-23 DIAGNOSIS — N63 Unspecified lump in unspecified breast: Secondary | ICD-10-CM

## 2016-09-26 ENCOUNTER — Ambulatory Visit
Admission: RE | Admit: 2016-09-26 | Discharge: 2016-09-26 | Disposition: A | Discharge status: Home / Self Care | Payer: 59 | Source: Ambulatory Visit | Attending: Obstetrics and Gynecology | Admitting: Obstetrics and Gynecology

## 2016-09-26 DIAGNOSIS — N63 Unspecified lump in unspecified breast: Secondary | ICD-10-CM

## 2017-01-01 ENCOUNTER — Emergency Department (HOSPITAL_COMMUNITY)
Admission: EM | Admit: 2017-01-01 | Discharge: 2017-01-01 | Disposition: A | Discharge status: Home / Self Care | Payer: 59 | Attending: Emergency Medicine | Admitting: Emergency Medicine

## 2017-01-01 ENCOUNTER — Encounter (HOSPITAL_COMMUNITY): Payer: Self-pay | Admitting: *Deleted

## 2017-01-01 DIAGNOSIS — M79651 Pain in right thigh: Secondary | ICD-10-CM | POA: Insufficient documentation

## 2017-01-01 DIAGNOSIS — Z5321 Procedure and treatment not carried out due to patient leaving prior to being seen by health care provider: Secondary | ICD-10-CM | POA: Insufficient documentation

## 2017-01-01 NOTE — ED Triage Notes (Signed)
The pt is c/o rt thigh pain that she woke up with at 0500am today.  No known injury  She reports light excercising   lmp none hysterectomy

## 2017-01-01 NOTE — ED Notes (Signed)
Patient called x 2 in lobby with no response.

## 2017-01-01 NOTE — ED Notes (Signed)
Patient called again with no response.  All areas of lobby checked.

## 2017-03-14 ENCOUNTER — Emergency Department (HOSPITAL_BASED_OUTPATIENT_CLINIC_OR_DEPARTMENT_OTHER)
Admission: EM | Admit: 2017-03-14 | Discharge: 2017-03-14 | Disposition: A | Discharge status: Home / Self Care | Payer: 59 | Attending: Physician Assistant | Admitting: Physician Assistant

## 2017-03-14 ENCOUNTER — Encounter (HOSPITAL_BASED_OUTPATIENT_CLINIC_OR_DEPARTMENT_OTHER): Payer: Self-pay | Admitting: *Deleted

## 2017-03-14 ENCOUNTER — Other Ambulatory Visit: Payer: Self-pay

## 2017-03-14 DIAGNOSIS — Z79899 Other long term (current) drug therapy: Secondary | ICD-10-CM | POA: Diagnosis not present

## 2017-03-14 DIAGNOSIS — Y929 Unspecified place or not applicable: Secondary | ICD-10-CM | POA: Insufficient documentation

## 2017-03-14 DIAGNOSIS — I10 Essential (primary) hypertension: Secondary | ICD-10-CM | POA: Insufficient documentation

## 2017-03-14 DIAGNOSIS — Y939 Activity, unspecified: Secondary | ICD-10-CM | POA: Diagnosis not present

## 2017-03-14 DIAGNOSIS — Y999 Unspecified external cause status: Secondary | ICD-10-CM | POA: Insufficient documentation

## 2017-03-14 DIAGNOSIS — M542 Cervicalgia: Secondary | ICD-10-CM | POA: Diagnosis present

## 2017-03-14 MED ORDER — IBUPROFEN 800 MG PO TABS: 800.0000 mg | ORAL_TABLET | Freq: Three times a day (TID) | ORAL | 0 refills | Status: AC

## 2017-03-14 MED ORDER — CYCLOBENZAPRINE HCL 10 MG PO TABS: 10.0000 mg | ORAL_TABLET | Freq: Two times a day (BID) | ORAL | 0 refills | Status: DC | PRN

## 2017-03-14 NOTE — ED Notes (Signed)
EDP into room, prior to RN assessment, see MD notes, pending orders.   
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
## 2017-03-14 NOTE — ED Notes (Signed)
Alert, NAD, calm, interactive, resps e/u, speaking in clear complete sentences, no dyspnea noted, skin W&D, VSS (denies: pain, sob, nausea, dizziness or visual changes).

## 2017-05-07 ENCOUNTER — Ambulatory Visit: Payer: Managed Care, Other (non HMO) | Admitting: Allergy

## 2017-05-07 ENCOUNTER — Encounter: Payer: Self-pay | Admitting: Allergy

## 2017-05-07 VITALS — BP 122/70 | HR 68 | Temp 98.2°F | Resp 18 | Ht 62.5 in | Wt 183.2 lb

## 2017-05-07 DIAGNOSIS — L2089 Other atopic dermatitis: Secondary | ICD-10-CM | POA: Diagnosis not present

## 2017-05-07 DIAGNOSIS — J309 Allergic rhinitis, unspecified: Secondary | ICD-10-CM | POA: Diagnosis not present

## 2017-05-07 DIAGNOSIS — H1010 Acute atopic conjunctivitis, unspecified eye: Secondary | ICD-10-CM | POA: Diagnosis not present

## 2017-05-07 DIAGNOSIS — T781XXD Other adverse food reactions, not elsewhere classified, subsequent encounter: Secondary | ICD-10-CM | POA: Diagnosis not present

## 2017-05-07 DIAGNOSIS — J4530 Mild persistent asthma, uncomplicated: Secondary | ICD-10-CM | POA: Diagnosis not present

## 2017-05-07 MED ORDER — MONTELUKAST SODIUM 10 MG PO TABS: 10.0000 mg | ORAL_TABLET | Freq: Every day | ORAL | 5 refills | Status: DC

## 2017-05-07 MED ORDER — AZELASTINE HCL 0.1 % NA SOLN: 2.0000 | Freq: Two times a day (BID) | NASAL | 12 refills | Status: DC

## 2017-05-07 MED ORDER — ALBUTEROL SULFATE HFA 108 (90 BASE) MCG/ACT IN AERS: 2.0000 | INHALATION_SPRAY | RESPIRATORY_TRACT | 5 refills | Status: DC | PRN

## 2017-05-07 MED ORDER — EPINEPHRINE 0.3 MG/0.3ML IJ SOAJ: INTRAMUSCULAR | 2 refills | Status: DC

## 2017-05-07 NOTE — Patient Instructions (Addendum)
Allergic rhinoconjunctivitis    - allergy testing today is positive to grasses    - for antihistamine recommend trial of Ryvent     - continue Flonase 2 sprays each nostril daily for nasal congestion.  Use for 1-2 weeks at a time before stopping once symptoms improved.     - for post nasal drip use Astelin 2 sprays twice a day for nasal drainage     - use Pazeo or Pataday 1 drop each eye as needed for itchy/watery/red eyes    - recommend starting Singulair 10mg  daily for both allergy symptoms control and asthma control    - allergen immunotherapy discussed including protocol, benefits and risks.   Sublingual immunotherapy also discussed for grass allergy  Asthma   - continue Arnuity 1 puff daily   - singulair as above   - have access to albuterol inhaler 2 puffs every 4-6 hours as needed for cough/wheeze/shortness of breath/chest tightness.  May use 15-20 minutes prior to activity.   Monitor frequency of use.    Asthma control goals:   Full participation in all desired activities (may need albuterol before activity)  Albuterol use two time or less a week on average (not counting use with activity)  Cough interfering with sleep two time or less a month  Oral steroids no more than once a year  No hospitalizations  Adverse food reaction   - soy, almond and pork are negative. Can obtain serum IgE levels at any time if you would like to re-introduce these foods into diet   - would recommend access to Epipen 0.3mg  in case of allergic reaction    - follow food action plan  Eczema   - continue Triamcinolone with flares as needed   - daily moisturization   Follow-up 4-6 months or sooner if needed

## 2017-05-07 NOTE — Progress Notes (Signed)
New Patient Note  RE: Andrea Bruce MRN: [id number] DOB: 1970/08/12 Date of Office Visit: 05/07/2017  Referring provider: Heywood Bene, * Primary care provider: Heywood Bene, PA-C  Chief Complaint: asthma and allergies  History of present illness: Andrea Bruce is a 47 y.o. female presenting today for consultation for asthma and allergies.    She has asthma diagnosed in adulthood after she had her 2nd child.  She has cough, wheezing, chest tightness and SOB.  She is on Arnuity 1 puff daily now for the past 2 years.  She reports steroids use about twice a year for flares.  No hospitalizations.  She reports nighttime awakenings about 1-2 times a month.  She reports using albuterol 2 times a day for last month due to having flu about a month or so ago.   When she is doing well she uses albuterol about 3 times a month. She does use prior to activity.   She states May-June and Dec-Feb can be bad for her asthma based on the weather.    She reports she had the flu several weeks ago and still has a bit of a cough related to that.  She also reports having post-nasal drip.  In regards to allergy symptoms she reports itchy eyes, runny nose, sneezing and hives (related to dog exposure) which start around end of December and continue and worsen in the summer.  She feels Flonase works better than nasonex.  Flonase use is as needed.   She has tried xyzal but feels it was not helpful.  She previously had tried Zyrtec, Claritin and Allegra which were not helpful.  She was previously seeing [facility] however the office is not convenient for her in location.     She reports that the odor of bacon makes her feel bad.   She reports she went to the ED in 12/2015 for a reaction involving facial swelling after cleaning chitlins.  She states she still does eat pork about once a year at holidays.  She avoids almond milk and soy as she reports throat itching and numbness sensation with  ingestion.  She does have a history of eczema but reports it is milder now that she is older.  She uses Triamcinolone with flares which does help.    Review of systems: Review of Systems  Constitutional: Negative for chills, fever and malaise/fatigue.  HENT: Positive for congestion. Negative for ear discharge, ear pain, nosebleeds, sinus pain and sore throat.   Eyes: Negative for pain, discharge and redness.  Respiratory: Positive for cough. Negative for sputum production, shortness of breath and wheezing.   Cardiovascular: Negative for chest pain.  Gastrointestinal: Negative for abdominal pain, constipation, diarrhea, heartburn, nausea and vomiting.  Musculoskeletal: Negative for joint pain.  Skin: Positive for itching and rash.  Neurological: Negative for headaches.    All other systems negative unless noted above in HPI  Past medical history: Past Medical History:  Diagnosis Date  . Anxiety   . Asthma   . Eczema   . GERD (gastroesophageal reflux disease)    uses papaya seeds for heartburn  . Hypertension   . Sleep apnea    uses a CPAP    Past surgical history: Past Surgical History:  Procedure Laterality Date  . ABDOMINAL HYSTERECTOMY Bilateral 07/19/2012   Procedure: HYSTERECTOMY ABDOMINAL WITH BILATERAL SALPINGECTOMY AND LYSIS OF ADHESIONS;  Surgeon: Olga Millers, MD;  Location: [facility];  Service: Gynecology;  Laterality: Bilateral;  . TUBAL  LIGATION      Family history:  Family History  Problem Relation Age of Onset  . Hypertension Mother   . Hypertension Father   . Breast cancer Sister   . Breast cancer Maternal Aunt     Social history: She lives in a home with carpeting with electric heating and central cooling.  There are dogs in the home.  There is no concern for water damage, mildew or roaches in the home.  She works [employer] buildings.  She denies any smoking history.     Medication List: Allergies as of 05/07/2017      Reactions    Penicillins    Childhood reaction   Latex Hives, Rash   Gloves & condoms      Medication List        Accurate as of 05/07/17  5:12 PM. Always use your most recent med list.          albuterol 108 (90 Base) MCG/ACT inhaler Commonly known as:  VENTOLIN HFA Inhale 2 puffs into the lungs every 4 (four) hours as needed for wheezing or shortness of breath.   ARNUITY ELLIPTA 100 MCG/ACT Aepb Generic drug:  Fluticasone Furoate Inhale 1 puff into the lungs daily.   azelastine 0.1 % nasal spray Commonly known as:  ASTELIN Place 2 sprays into both nostrils 2 (two) times daily. Use in each nostril as directed   EPINEPHrine 0.3 mg/0.3 mL Soaj injection Commonly known as:  EPIPEN 2-PAK EpiPen 2-Pak 0.3 mg/0.3 mL injection, auto-injector   fluticasone 50 MCG/ACT nasal spray Commonly known as:  FLONASE fluticasone 50 mcg/actuation nasal spray,suspension   hydrochlorothiazide 25 MG tablet Commonly known as:  HYDRODIURIL TAKE 1 TABLET BY MOUTH  DAILY   ibuprofen 800 MG tablet Commonly known as:  ADVIL,MOTRIN Take 1 tablet (800 mg total) by mouth 3 (three) times daily.   LORazepam 0.5 MG tablet Commonly known as:  ATIVAN Take 0.5 mg by mouth as needed for anxiety.   metoprolol tartrate 25 MG tablet Commonly known as:  LOPRESSOR   montelukast 10 MG tablet Commonly known as:  SINGULAIR Take 1 tablet (10 mg total) by mouth at bedtime.   Potassium Chloride ER 20 MEQ Tbcr Take by mouth.   triamcinolone cream 0.1 % Commonly known as:  KENALOG APPLY TO AFFECTED AREA TWICE A DAY       Known medication allergies: Allergies  Allergen Reactions  . Penicillins     Childhood reaction  . Latex Hives and Rash    Gloves & condoms     Physical examination: Blood pressure 122/70, pulse 68, temperature 98.2 F (36.8 C), resp. rate 18, height 5' 2.5" (1.588 m), weight 183 lb 3.2 oz (83.1 kg), last menstrual period 04/24/2013, SpO2 96 %.  General: Alert, interactive, in no acute  distress. HEENT: PERRLA, TMs pearly gray, turbinates mildly edematous with clear discharge, post-pharynx non erythematous. Neck: Supple without lymphadenopathy. Lungs: Clear to auscultation without wheezing, rhonchi or rales. {no increased work of breathing. CV: Normal S1, S2 without murmurs. Abdomen: Nondistended, nontender. Skin: Warm and dry, without lesions or rashes. Extremities:  No clubbing, cyanosis or edema. Neuro:   Grossly intact.  Diagnositics/Labs:  Spirometry: FEV1: 2.47L  94%, FVC: 2.88L  96%, ratio consistent with nonobstructive pattern  Allergy testing: environmental allergy panel is positive to grasses.  Intradermal testing is positive to Mold 2 and 4.   Food allergy testing is negative to almond, soy and pork Allergy testing results were read and interpreted by  provider, documented by clinical staff.   Assessment and plan:   Allergic rhinoconjunctivitis    - allergy testing today is positive to grasses and mold    - for antihistamine recommend trial of Ryvent     - continue Flonase 2 sprays each nostril daily for nasal congestion.  Use for 1-2 weeks at a time before stopping once symptoms improved.     - for post nasal drip use Astelin 2 sprays twice a day for nasal drainage     - use Pazeo or Pataday 1 drop each eye as needed for itchy/watery/red eyes    - recommend starting Singulair 10mg  daily for both allergy symptoms control and asthma control    - allergen immunotherapy discussed including protocol, benefits and risks.   Sublingual immunotherapy also discussed for grass allergy  Asthma   - continue Arnuity 1 puff daily   - singulair as above   - have access to albuterol inhaler 2 puffs every 4-6 hours as needed for cough/wheeze/shortness of breath/chest tightness.  May use 15-20 minutes prior to activity.   Monitor frequency of use.    Asthma control goals:   Full participation in all desired activities (may need albuterol before activity)  Albuterol  use two time or less a week on average (not counting use with activity)  Cough interfering with sleep two time or less a month  Oral steroids no more than once a year  No hospitalizations  Adverse food reaction   - soy, almond and pork are negative. Can obtain serum IgE levels at any time if you would like to re-introduce these foods into diet   - would recommend access to Epipen 0.3mg  in case of allergic reaction    - follow food action plan  Atopic dermatitis   - continue Triamcinolone with flares as needed   - daily moisturization   Follow-up 4-6 months or sooner if needed  I appreciate the opportunity to take part in [place] care. Please do not hesitate to contact me with questions.  Sincerely,   Prudy Feeler, MD Allergy/Immunology [facility] [place]

## 2017-05-08 ENCOUNTER — Other Ambulatory Visit: Payer: Self-pay

## 2017-05-08 NOTE — Telephone Encounter (Signed)
Left message for pt to return call on pt's work number. Her home phone and mobile phone were not working/out of service.

## 2017-05-08 NOTE — Telephone Encounter (Signed)
Patient was seen on yesterday. Her Epi Pen and Montelukast was not at the [facility] , but her other prescriptions were. She also is still reaction to the intradermals done. She stated she can not do benadryl it will make her sleepy what else can she try to calm this itching and swelling down.

## 2017-05-08 NOTE — Telephone Encounter (Signed)
Pt returned call, Pt was asking if Montelukast and Epi was sent into pharmacy. Confirmed RX's were sent.

## 2018-04-04 ENCOUNTER — Other Ambulatory Visit: Payer: Self-pay | Admitting: Allergy

## 2018-04-04 DIAGNOSIS — J4530 Mild persistent asthma, uncomplicated: Secondary | ICD-10-CM

## 2018-04-04 DIAGNOSIS — H1010 Acute atopic conjunctivitis, unspecified eye: Secondary | ICD-10-CM

## 2018-04-04 DIAGNOSIS — J309 Allergic rhinitis, unspecified: Principal | ICD-10-CM

## 2018-05-13 IMAGING — MG 2D DIGITAL DIAGNOSTIC BILATERAL MAMMOGRAM WITH CAD AND ADJUNCT T
6 series · 6 of 10 positions shown · non-contrast
Comparison: Previous exam(s).

CLINICAL DATA: 45-year-old female presented in for evaluation of a
palpable left breast mass. The patient states this has been present
and unchanged for at least 24 years.

EXAM:
2D DIGITAL DIAGNOSTIC BILATERAL MAMMOGRAM WITH CAD AND ADJUNCT TOMO

[R CC]
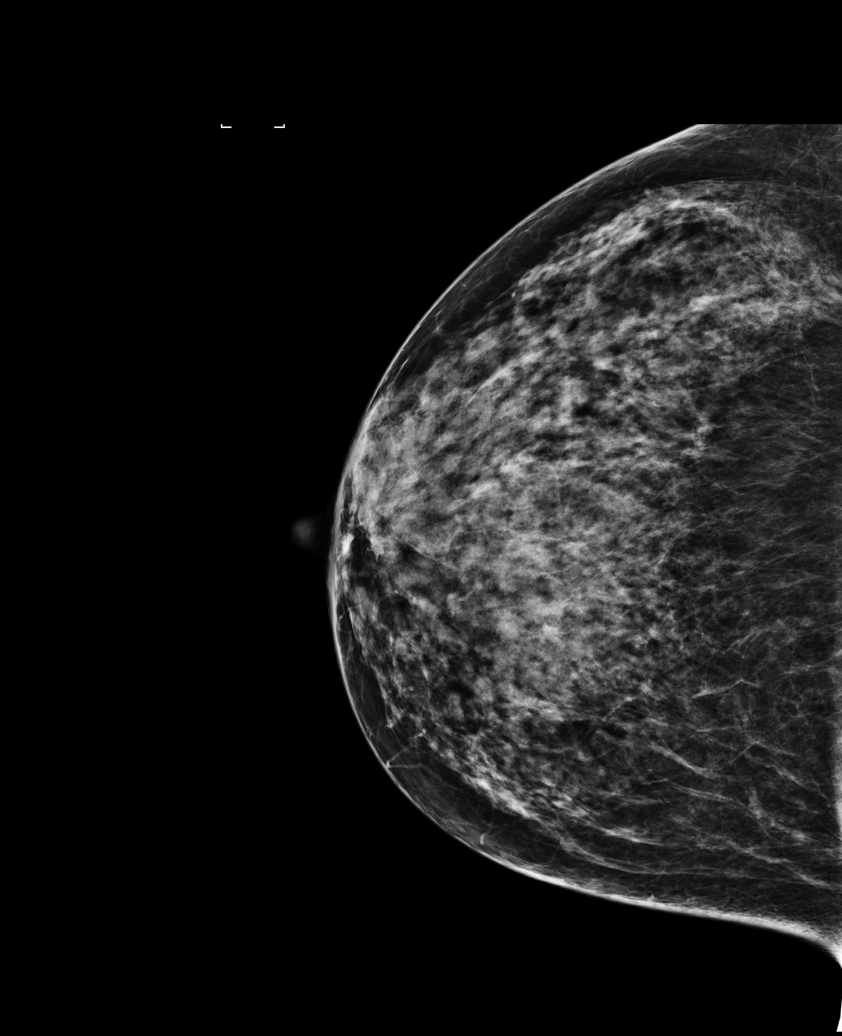

[L CC synth-2D]
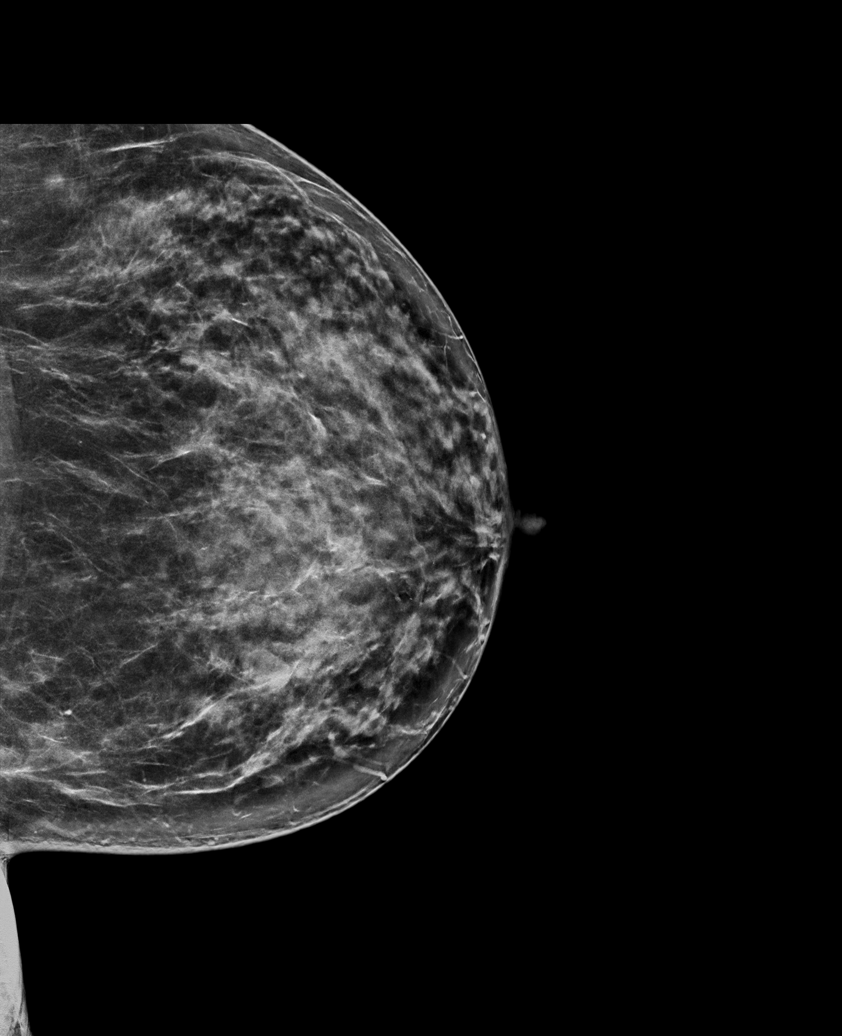

[R MLO synth-2D]
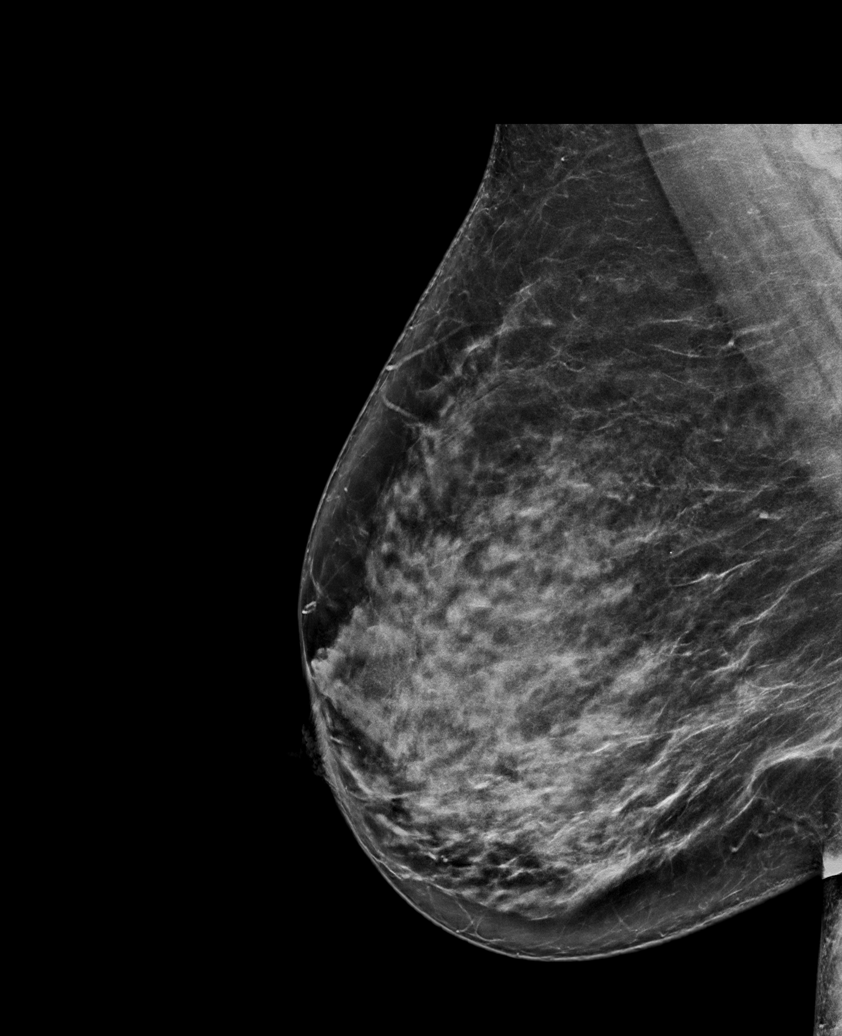

[R CC synth-2D]
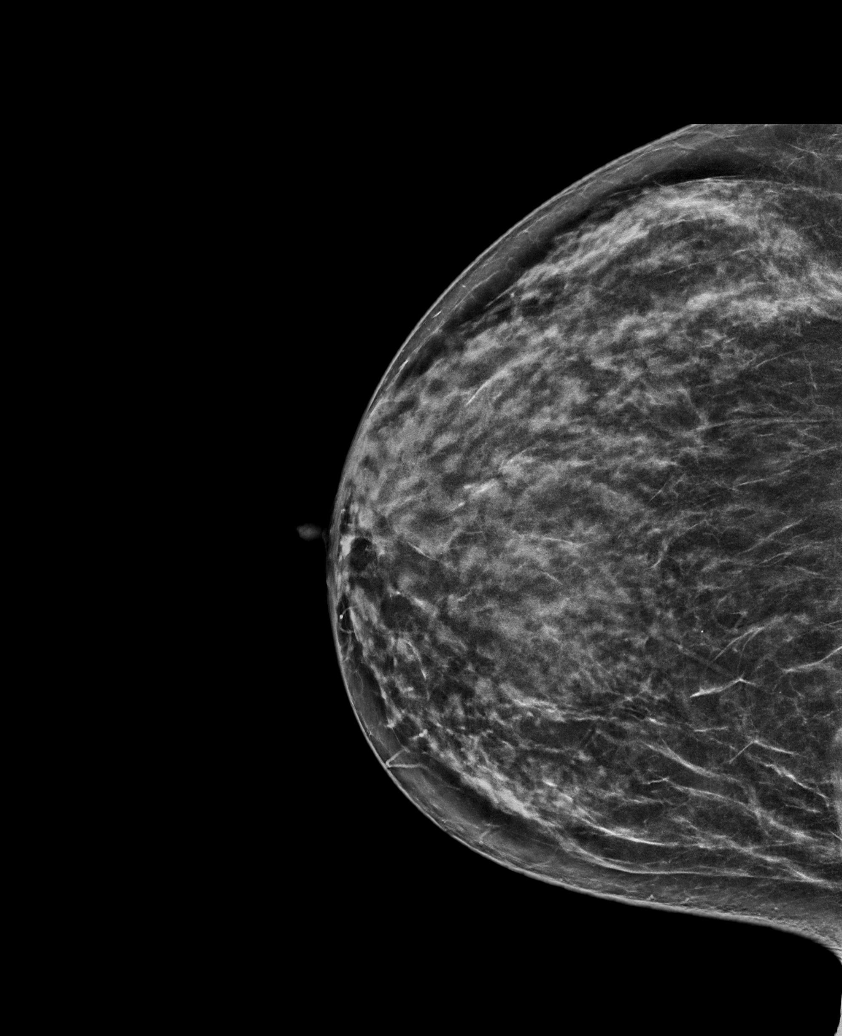

[L MLO synth-2D]
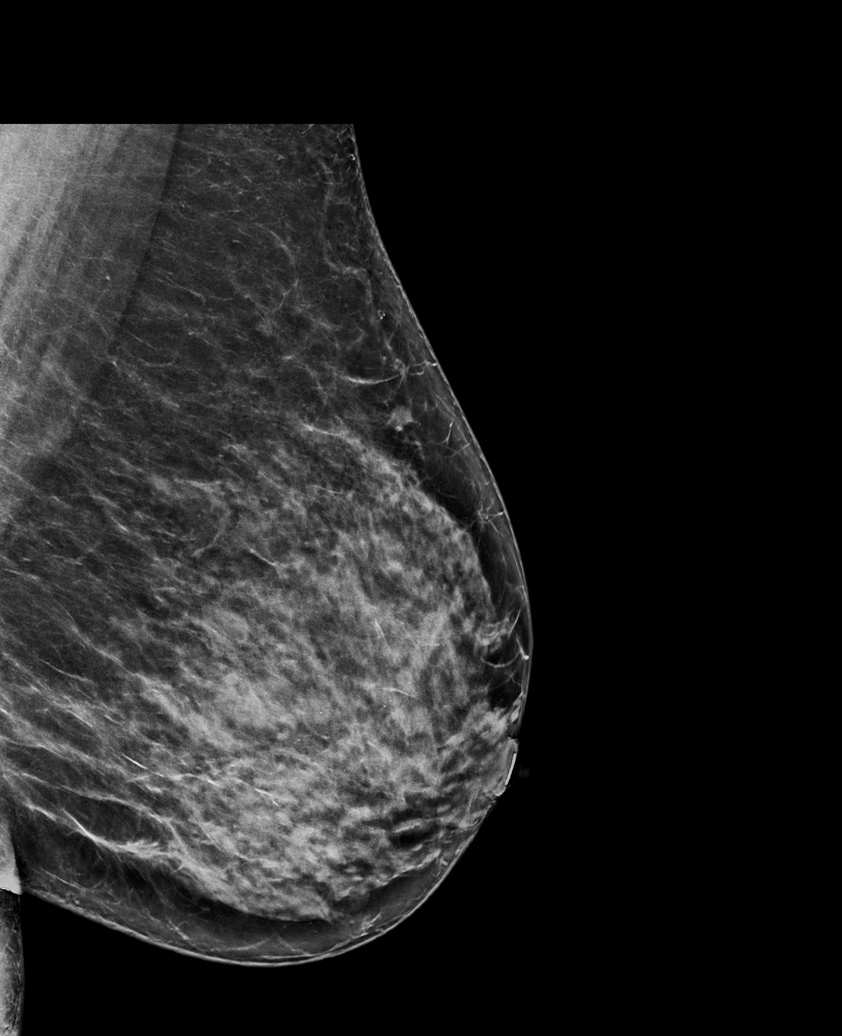

[R CC tomo · tomo slice 39/76.0]
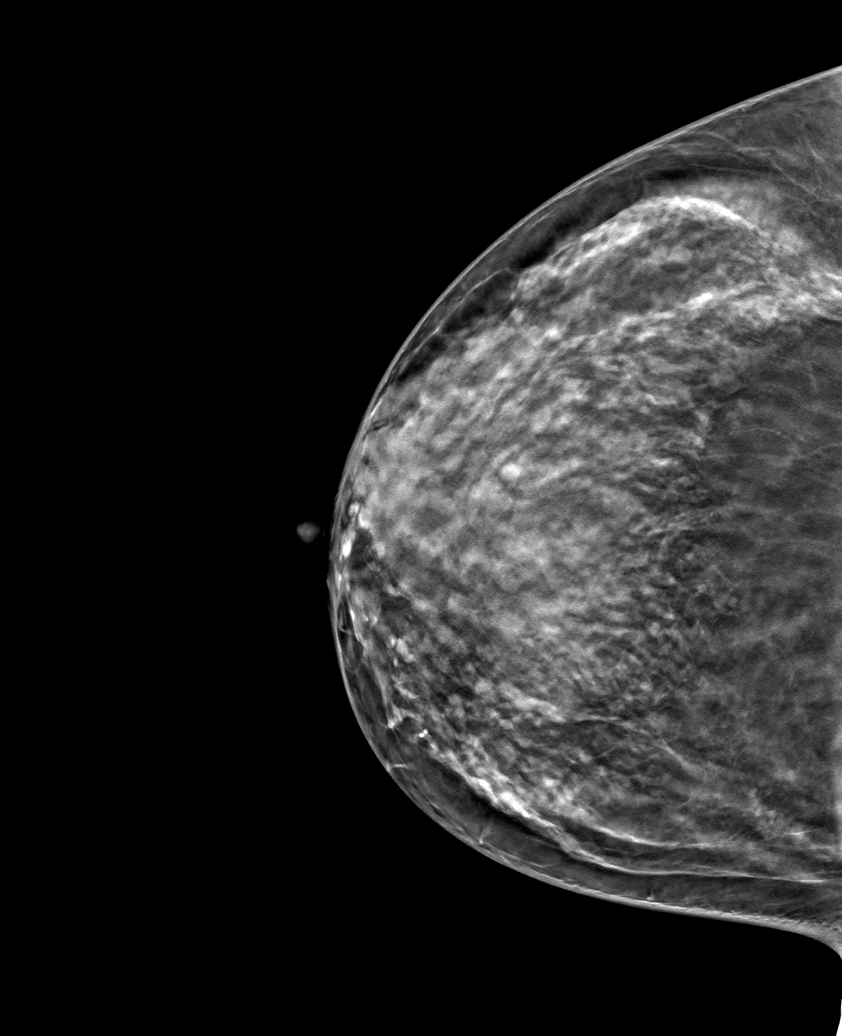

[6 of 10 positions shown; findings below may reference images not displayed]

ACR Breast Density Category c: The breast tissue is heterogeneously
dense, which may obscure small masses.
FINDINGS: The palpable mass in the upper inner quadrant of the left breast
measures 1.4 cm, and has been stable since the 08/17/2014 exam.
There is a smaller mass in the lower-inner quadrant of the left
breast measuring approximately 1 cm which is also stable from the
prior exam.

Mammographic images were processed with CAD.
IMPRESSION: 1. There are 2 masses in the left breast, 1 in the upper inner and
the other in the lower-inner quadrant which have demonstrated
greater than 2 years of stability consistent with benign findings,
likely fibroadenomas.

2.  No mammographic evidence of malignancy in the bilateral breasts.

RECOMMENDATION:
Screening mammogram in one year.(Code:[id number])

I have discussed the findings and recommendations with the patient.
Results were also provided in writing at the conclusion of the
visit. If applicable, a reminder letter will be sent to the patient
regarding the next appointment.

BI-RADS CATEGORY  2: Benign.

## 2018-06-09 ENCOUNTER — Telehealth: Payer: Self-pay | Admitting: Allergy

## 2018-06-09 ENCOUNTER — Other Ambulatory Visit: Payer: Self-pay | Admitting: *Deleted

## 2018-06-09 NOTE — Telephone Encounter (Signed)
Denied patient has not been seen since 05/2017. Needs to keep appt for refills. Called patient phone disconnected

## 2018-06-09 NOTE — Telephone Encounter (Signed)
Patient is requesting a courtesy refill on her pro air. She made an appt for 06-25-18. [facility] [address]

## 2018-06-09 NOTE — Telephone Encounter (Signed)
Received refill request for Proair [facility] faxed back [facility] [address] [phone] denial due to patient not being seen since 05/2017

## 2018-06-10 NOTE — Telephone Encounter (Signed)
Please call patient about denied refill , patient states she really needs a refill.    Phone: [phone]

## 2018-06-10 NOTE — Telephone Encounter (Signed)
Spoke with patient.  She is aware of office policy and understands why we cannot call in albuterol.  She is coming in to see Dr. Maudie Mercury next Wednesday.

## 2018-06-16 ENCOUNTER — Ambulatory Visit: Payer: BC Managed Care – PPO | Admitting: Allergy

## 2018-06-16 ENCOUNTER — Other Ambulatory Visit: Payer: Self-pay

## 2018-06-16 ENCOUNTER — Encounter: Payer: Self-pay | Admitting: Allergy

## 2018-06-16 VITALS — BP 108/60 | HR 72 | Resp 16 | Ht 62.5 in | Wt 177.6 lb

## 2018-06-16 DIAGNOSIS — H1010 Acute atopic conjunctivitis, unspecified eye: Secondary | ICD-10-CM | POA: Insufficient documentation

## 2018-06-16 DIAGNOSIS — T781XXD Other adverse food reactions, not elsewhere classified, subsequent encounter: Secondary | ICD-10-CM

## 2018-06-16 DIAGNOSIS — L2089 Other atopic dermatitis: Secondary | ICD-10-CM | POA: Diagnosis not present

## 2018-06-16 DIAGNOSIS — T781XXA Other adverse food reactions, not elsewhere classified, initial encounter: Secondary | ICD-10-CM | POA: Insufficient documentation

## 2018-06-16 DIAGNOSIS — J4530 Mild persistent asthma, uncomplicated: Secondary | ICD-10-CM | POA: Insufficient documentation

## 2018-06-16 DIAGNOSIS — J309 Allergic rhinitis, unspecified: Secondary | ICD-10-CM | POA: Diagnosis not present

## 2018-06-16 MED ORDER — EPINEPHRINE 0.3 MG/0.3ML IJ SOAJ: INTRAMUSCULAR | 2 refills | Status: DC

## 2018-06-16 MED ORDER — ALBUTEROL SULFATE HFA 108 (90 BASE) MCG/ACT IN AERS: 2.0000 | INHALATION_SPRAY | RESPIRATORY_TRACT | 1 refills | Status: DC | PRN

## 2018-06-16 MED ORDER — FLUTICASONE FUROATE 100 MCG/ACT IN AEPB: 1.0000 | INHALATION_SPRAY | Freq: Every day | RESPIRATORY_TRACT | 5 refills | Status: DC

## 2018-06-16 MED ORDER — TRIAMCINOLONE ACETONIDE 0.1 % EX CREA: TOPICAL_CREAM | CUTANEOUS | 1 refills | Status: DC

## 2018-06-16 MED ORDER — OLOPATADINE HCL 0.7 % OP SOLN: 1.0000 [drp] | Freq: Every day | OPHTHALMIC | 5 refills | Status: DC | PRN

## 2018-06-16 MED ORDER — MONTELUKAST SODIUM 10 MG PO TABS: 10.0000 mg | ORAL_TABLET | Freq: Every day | ORAL | 5 refills | Status: DC

## 2018-06-16 MED ORDER — AZELASTINE HCL 0.1 % NA SOLN: NASAL | 12 refills | Status: DC

## 2018-06-16 MED ORDER — FLUTICASONE PROPIONATE 50 MCG/ACT NA SUSP: NASAL | 5 refills | Status: DC

## 2018-06-16 NOTE — Assessment & Plan Note (Signed)
Currently avoiding soy, almond and pork. No reactions since the last visit.  Continue avoidance. If interested in reintroduction recommend getting bloodwork first.  For mild symptoms you can take over the counter antihistamines such as Benadryl and monitor symptoms closely. If symptoms worsen or if you have severe symptoms including breathing issues, throat closure, significant swelling, whole body hives, severe diarrhea and vomiting, lightheadedness then inject epinephrine and seek immediate medical care afterwards.

## 2018-06-16 NOTE — Assessment & Plan Note (Signed)
Past history - 2019 skin testing was positive to grass and mold.  Interim history - symptoms are flaring again. She did have good benefit last year with below regimen.  Continue environmental cortol measures.   May use over the counter antihistamines such as Zyrtec (cetirizine), Claritin (loratadine), Allegra (fexofenadine), or Xyzal (levocetirizine) daily as needed.  Start Flonase 2 sprays each nostril daily for nasal congestion.  Use for 1-2 weeks at a time before stopping once symptoms improved.   For post nasal drip use Astelin 2 sprays twice a day.  Use Pazeo 1 drop each eye as needed for itchy/watery/red eyes  Continue Singulair 10mg  daily for both allergy symptoms control and asthma control

## 2018-06-16 NOTE — Assessment & Plan Note (Signed)
Stopped Arnuity in October and the last few weeks noticing flare of her symptoms requiring albuterol 4-5 times a week and ran out of her inhaler.  Today's spirometry was normal. Daily controller medication(s): start Arnuity 100 1 puff once a day and rinse mouth afterwards Prior to physical activity: May use albuterol rescue inhaler 2 puffs 5 to 15 minutes prior to strenuous physical activities. Rescue medications: May use albuterol rescue inhaler 2 puffs or nebulizer every 4 to 6 hours as needed for shortness of breath, chest tightness, coughing, and wheezing. Monitor frequency of use.  If symptoms do not improve advised patient to call our office as we may need to step up therapy.

## 2018-06-16 NOTE — Assessment & Plan Note (Signed)
Waxes and wanes.  Continue skin care measures.  May use topical triamcinolone 0.1% cream twice a day as needed for flares below the neck up to 2 weeks at a time.

## 2018-06-16 NOTE — Patient Instructions (Addendum)
Allergic rhinoconjunctivitis  Testing in the past was positive to grasses and mold. Continue environmental conrtol measures.   May use over the counter antihistamines such as Zyrtec (cetirizine), Claritin (loratadine), Allegra (fexofenadine), or Xyzal (levocetirizine) daily as needed.  Start Flonase 2 sprays each nostril daily for nasal congestion.  Use for 1-2 weeks at a time before stopping once symptoms improved.   For post nasal drip use Astelin 2 sprays twice a day for nasal drainage   Use Pazeo or Pataday 1 drop each eye as needed for itchy/watery/red eyes  Continue Singulair 10mg  daily for both allergy symptoms control and asthma control  Asthma Daily controller medication(s): start Arnuity 100 1 puff once a day and rinse mouth afterwards Prior to physical activity: May use albuterol rescue inhaler 2 puffs 5 to 15 minutes prior to strenuous physical activities. Rescue medications: May use albuterol rescue inhaler 2 puffs or nebulizer every 4 to 6 hours as needed for shortness of breath, chest tightness, coughing, and wheezing. Monitor frequency of use.  Asthma control goals:  Full participation in all desired activities (may need albuterol before activity) Albuterol use two times or less a week on average (not counting use with activity) Cough interfering with sleep two times or less a month Oral steroids no more than once a year No hospitalizations  Adverse food reaction  Continue to avoid soy, almond and pork.  For mild symptoms you can take over the counter antihistamines such as Benadryl and monitor symptoms closely. If symptoms worsen or if you have severe symptoms including breathing issues, throat closure, significant swelling, whole body hives, severe diarrhea and vomiting, lightheadedness then inject epinephrine and seek immediate medical care afterwards.  Atopic dermatitis   - continue Triamcinolone with flares as needed   - daily moisturization  Follow up in 6  months - sooner if needed.

## 2018-06-16 NOTE — Progress Notes (Signed)
Follow Up Note  RE: Andrea Bruce MRN: [id number] DOB: 1970/09/25 Date of Office Visit: 06/16/2018  Referring provider: Heywood Bene, * Primary care provider: Heywood Bene, PA-C  Chief Complaint: Asthma (refills needed under control) and Allergic Rhinitis   History of Present Illness: I had the pleasure of seeing Andrea Bruce for a follow up visit at the [facility] on 06/16/2018. She is a 48 y.o. female, who is being followed for allergic rhino conjunctivitis, asthma, adverse food reaction, atopic dermatitis. Today she is here for regular follow up visit. Her previous allergy office visit was on 05/07/2017 with Dr. Nelva Bush.   Allergic rhinoconjunctivitis She is having itchy eyes and nasal symptoms.  Patient used nasal sprays and eye drops last year with good benefit and would like a refill.   Asthma Patient ran out of Felicity in October and noticed some worsening asthma symptoms but she has been using Singulair daily which helped. However, the past 3 weeks patient has been using albuterol at least 4 times a week with some benefit for wheezing and SOB.   Denies any ER/urgent care visits or prednisone use since the last visit.  Adverse food reaction Currently avoiding soy, almond and pork and no reactions since the last visit.  Atopic dermatitis Waxes and wanes.  Assessment and Plan: [name] is a 48 y.o. female with: Mild persistent asthma, uncomplicated Stopped Arnuity in October and the last few weeks noticing flare of her symptoms requiring albuterol 4-5 times a week and ran out of her inhaler.  Today's spirometry was normal. Daily controller medication(s): start Arnuity 100 1 puff once a day and rinse mouth afterwards Prior to physical activity: May use albuterol rescue inhaler 2 puffs 5 to 15 minutes prior to strenuous physical activities. Rescue medications: May use albuterol rescue inhaler 2 puffs or nebulizer every 4 to 6 hours as  needed for shortness of breath, chest tightness, coughing, and wheezing. Monitor frequency of use.  If symptoms do not improve advised patient to call our office as we may need to step up therapy.   Allergic rhinoconjunctivitis Past history - 2019 skin testing was positive to grass and mold.  Interim history - symptoms are flaring again. She did have good benefit last year with below regimen.  Continue environmental cortol measures.   May use over the counter antihistamines such as Zyrtec (cetirizine), Claritin (loratadine), Allegra (fexofenadine), or Xyzal (levocetirizine) daily as needed.  Start Flonase 2 sprays each nostril daily for nasal congestion.  Use for 1-2 weeks at a time before stopping once symptoms improved.   For post nasal drip use Astelin 2 sprays twice a day.  Use Pazeo 1 drop each eye as needed for itchy/watery/red eyes  Continue Singulair 10mg  daily for both allergy symptoms control and asthma control  Adverse food reaction Currently avoiding soy, almond and pork. No reactions since the last visit.  Continue avoidance. If interested in reintroduction recommend getting bloodwork first.  For mild symptoms you can take over the counter antihistamines such as Benadryl and monitor symptoms closely. If symptoms worsen or if you have severe symptoms including breathing issues, throat closure, significant swelling, whole body hives, severe diarrhea and vomiting, lightheadedness then inject epinephrine and seek immediate medical care afterwards.  Other atopic dermatitis Waxes and wanes.  Continue skin care measures.  May use topical triamcinolone 0.1% cream twice a day as needed for flares below the neck up to 2 weeks at a time.   Return  in about 6 months (around 12/17/2018).  Meds ordered this encounter  Medications  . albuterol (VENTOLIN HFA) 108 (90 Base) MCG/ACT inhaler    Sig: Inhale 2 puffs into the lungs every 4 (four) hours as needed for wheezing or shortness  of breath.    Dispense:  1 Inhaler    Refill:  1  . azelastine (ASTELIN) 0.1 % nasal spray    Sig: Take 1-2 sprays twice a day as needed for runny nose.    Dispense:  30 mL    Refill:  12  . EPINEPHrine (EPIPEN 2-PAK) 0.3 mg/0.3 mL IJ SOAJ injection    Sig: EpiPen 2-Pak 0.3 mg/0.3 mL injection, auto-injector    Dispense:  2 Device    Refill:  2  . Fluticasone Furoate (ARNUITY ELLIPTA) 100 MCG/ACT AEPB    Sig: Inhale 1 puff into the lungs daily.    Dispense:  30 each    Refill:  5  . montelukast (SINGULAIR) 10 MG tablet    Sig: Take 1 tablet (10 mg total) by mouth at bedtime.    Dispense:  30 tablet    Refill:  5  . fluticasone (FLONASE) 50 MCG/ACT nasal spray    Sig: Use 2 sprays per nostril each nostril daily for nasal congestion    Dispense:  16 g    Refill:  5  . Olopatadine HCl (PAZEO) 0.7 % SOLN    Sig: Apply 1 drop to eye daily as needed (for itchy eyes).    Dispense:  1 Bottle    Refill:  5  . triamcinolone cream (KENALOG) 0.1 %    Sig: APPLY TO AFFECTED AREA TWICE A DAY as needed. Use below the neck no more than 2 weeks at a time.    Dispense:  30 g    Refill:  1   Diagnostics: Spirometry:  Tracings reviewed. Her effort: Good reproducible efforts. FVC: 2.87L FEV1: 2.20L, 95% predicted FEV1/FVC ratio: 90% Interpretation: Spirometry consistent with normal pattern.  Please see scanned spirometry results for details.  Medication List:  Current Outpatient Medications  Medication Sig Dispense Refill  . albuterol (VENTOLIN HFA) 108 (90 Base) MCG/ACT inhaler Inhale 2 puffs into the lungs every 4 (four) hours as needed for wheezing or shortness of breath. 1 Inhaler 1  . azelastine (ASTELIN) 0.1 % nasal spray Take 1-2 sprays twice a day as needed for runny nose. 30 mL 12  . EPINEPHrine (EPIPEN 2-PAK) 0.3 mg/0.3 mL IJ SOAJ injection EpiPen 2-Pak 0.3 mg/0.3 mL injection, auto-injector 2 Device 2  . fluticasone (FLONASE) 50 MCG/ACT nasal spray Use 2 sprays per nostril each  nostril daily for nasal congestion 16 g 5  . Fluticasone Furoate (ARNUITY ELLIPTA) 100 MCG/ACT AEPB Inhale 1 puff into the lungs daily. 30 each 5  . hydrochlorothiazide (HYDRODIURIL) 25 MG tablet TAKE 1 TABLET BY MOUTH  DAILY    . ibuprofen (ADVIL,MOTRIN) 800 MG tablet Take 1 tablet (800 mg total) by mouth 3 (three) times daily. 21 tablet 0  . LORazepam (ATIVAN) 0.5 MG tablet Take 0.5 mg by mouth as needed for anxiety.     . metoprolol tartrate (LOPRESSOR) 25 MG tablet     . montelukast (SINGULAIR) 10 MG tablet Take 1 tablet (10 mg total) by mouth at bedtime. 30 tablet 5  . Potassium Chloride ER 20 MEQ TBCR Take by mouth.    . triamcinolone cream (KENALOG) 0.1 % APPLY TO AFFECTED AREA TWICE A DAY as needed. Use below the neck no more  than 2 weeks at a time. 30 g 1  . Olopatadine HCl (PAZEO) 0.7 % SOLN Apply 1 drop to eye daily as needed (for itchy eyes). 1 Bottle 5   No current facility-administered medications for this visit.    Allergies: Allergies  Allergen Reactions  . Penicillins     Childhood reaction  . Latex Hives and Rash    Gloves & condoms   I reviewed her past medical history, social history, family history, and environmental history and no significant changes have been reported from previous visit on 05/07/2017.  Review of Systems  Constitutional: Negative for appetite change, chills, fever and unexpected weight change.  HENT: Positive for congestion and rhinorrhea.   Eyes: Positive for itching.  Respiratory: Positive for shortness of breath and wheezing. Negative for cough and chest tightness.   Gastrointestinal: Negative for abdominal pain.  Skin: Negative for rash.  Allergic/Immunologic: Positive for environmental allergies.  Neurological: Negative for headaches.   Objective: BP 108/60 (BP Location: Left Arm, Patient Position: Sitting, Cuff Size: Normal)   Pulse 72   Resp 16   Ht 5' 2.5" (1.588 m)   Wt 177 lb 9.6 oz (80.6 kg)   LMP 04/24/2013   SpO2 99%   BMI  31.97 kg/m  Body mass index is 31.97 kg/m. Physical Exam  Constitutional: She is oriented to person, place, and time. She appears well-developed and well-nourished.  HENT:  Head: Normocephalic and atraumatic.  Right Ear: External ear normal.  Left Ear: External ear normal.  Nose: Nose normal.  Mouth/Throat: Oropharynx is clear and moist.  Eyes: Conjunctivae and EOM are normal.  Neck: Neck supple.  Cardiovascular: Normal rate, regular rhythm and normal heart sounds. Exam reveals no gallop and no friction rub.  No murmur heard. Pulmonary/Chest: Effort normal and breath sounds normal. She has no wheezes. She has no rales.  Neurological: She is alert and oriented to person, place, and time.  Skin: Skin is warm. No rash noted.  Psychiatric: She has a normal mood and affect. Her behavior is normal.  Nursing note and vitals reviewed.  Previous notes and tests were reviewed. The plan was reviewed with the patient/family, and all questions/concerned were addressed.  It was my pleasure to see Madoline today and participate in her care. Please feel free to contact me with any questions or concerns.  Sincerely,  Rexene Alberts, DO Allergy & Immunology  [facility] office: [phone] [facility] office: [phone]

## 2018-06-25 ENCOUNTER — Ambulatory Visit: Payer: Managed Care, Other (non HMO) | Admitting: Allergy

## 2018-08-12 ENCOUNTER — Encounter (HOSPITAL_COMMUNITY): Payer: Self-pay

## 2018-08-12 ENCOUNTER — Ambulatory Visit (HOSPITAL_COMMUNITY)
Admission: EM | Admit: 2018-08-12 | Discharge: 2018-08-12 | Disposition: A | Discharge status: Home / Self Care | Payer: BC Managed Care – PPO | Attending: Family Medicine | Admitting: Family Medicine

## 2018-08-12 DIAGNOSIS — M25562 Pain in left knee: Secondary | ICD-10-CM | POA: Diagnosis not present

## 2018-08-12 DIAGNOSIS — I10 Essential (primary) hypertension: Secondary | ICD-10-CM

## 2018-08-12 MED ORDER — DICLOFENAC SODIUM 75 MG PO TBEC: 75.0000 mg | DELAYED_RELEASE_TABLET | Freq: Two times a day (BID) | ORAL | 0 refills | Status: AC

## 2018-08-12 NOTE — ED Triage Notes (Signed)
Pt present a cyst on her left leg between the knee caps. Pt states that cyst is painful to touch.

## 2018-08-17 NOTE — ED Provider Notes (Signed)
[place]   [id number] 08/12/18 Arrival Time: 7062  ASSESSMENT & PLAN:  1. Acute pain of left knee   2. Benign essential HTN    Possible Baker cyst. Discussed. WBAT.  Trial of: Meds ordered this encounter  Medications  . diclofenac (VOLTAREN) 75 MG EC tablet    Sig: Take 1 tablet (75 mg total) by mouth 2 (two) times daily.    Dispense:  14 tablet    Refill:  0   Monitor BP. She has not taken her medication today.  Follow-up Information    Schedule an appointment as soon as possible for a visit  with Heywood Bene, PA-C.   Specialty:  Physician Assistant Contact information: [address] [place] [address] [phone]          Reviewed expectations re: course of current medical issues. Questions answered. Outlined signs and symptoms indicating need for more acute intervention. Patient verbalized understanding. After Visit Summary given.  SUBJECTIVE: History from: patient. Andrea Bruce is a 48 y.o. female who reports mild to moderate pain of her posterior left knee; described as aching without radiation. "Full feeling". Onset: gradual, over the past several days. Injury/trama: no. Symptoms have progressed to a point and plateaued since beginning. Aggravating factors: certain movements at times. Alleviating factors: rest. Associated symptoms: none reported. Extremity sensation changes or weakness: none. Self treatment: has not tried OTCs for relief of pain. History of similar: no. No locking or instability of knee.  Past Surgical History:  Procedure Laterality Date  . ABDOMINAL HYSTERECTOMY Bilateral 07/19/2012   Procedure: HYSTERECTOMY ABDOMINAL WITH BILATERAL SALPINGECTOMY AND LYSIS OF ADHESIONS;  Surgeon: Olga Millers, MD;  Location: [facility];  Service: Gynecology;  Laterality: Bilateral;  . TUBAL LIGATION      Increased blood pressure noted today. Reports that she has been treated for hypertension in the past.  She  reports no chest pain on exertion, no dyspnea on exertion, no swelling of ankles, no orthostatic dizziness or lightheadedness, no orthopnea or paroxysmal nocturnal dyspnea, no palpitations and no intermittent claudication symptoms.  ROS: As per HPI. All other systems negative.    OBJECTIVE:  Vitals:   08/12/18 1548  BP: (!) 130/109  Pulse: 80  Resp: 16  Temp: 98.5 F (36.9 C)  TempSrc: Oral  SpO2: 100%    General appearance: alert; no distress HEENT: ; AT CV: RRR Lungs: CTAB; unlabored Extremities: . LLE: warm and well perfused; poorly localized mild to moderate tenderness over left knee; without gross deformities; with no swelling; with no bruising; ROM: normal with reported discomfort CV: brisk extremity capillary refill of LLE; 2+ DP and PT pulse of LLE. Skin: warm and dry; no visible rashes Neurologic: gait normal; normal reflexes of RLE and LLE; normal sensation of RLE and LLE; normal strength of RLE and LLE Psychological: alert and cooperative; normal mood and affect  Allergies  Allergen Reactions  . Penicillins     Childhood reaction  . Latex Hives and Rash    Gloves & condoms    Past Medical History:  Diagnosis Date  . Anxiety   . Asthma   . Eczema   . GERD (gastroesophageal reflux disease)    uses papaya seeds for heartburn  . Hypertension   . Sleep apnea    uses a CPAP   Social History   Socioeconomic History  . Marital status: Married    Spouse name: Not on file  . Number of children: Not on file  .  Years of education: Not on file  . Highest education level: Not on file  Occupational History  . Not on file  Social Needs  . Financial resource strain: Not on file  . Food insecurity:    Worry: Not on file    Inability: Not on file  . Transportation needs:    Medical: Not on file    Non-medical: Not on file  Tobacco Use  . Smoking status: Never Smoker  . Smokeless tobacco: Never Used  Substance and Sexual Activity  . Alcohol use: No  .  Drug use: No  . Sexual activity: Never    Birth control/protection: None  Lifestyle  . Physical activity:    Days per week: Not on file    Minutes per session: Not on file  . Stress: Not on file  Relationships  . Social connections:    Talks on phone: Not on file    Gets together: Not on file    Attends religious service: Not on file    Active member of club or organization: Not on file    Attends meetings of clubs or organizations: Not on file    Relationship status: Not on file  Other Topics Concern  . Not on file  Social History Narrative  . Not on file   Family History  Problem Relation Age of Onset  . Hypertension Mother   . Hypertension Father   . Breast cancer Sister   . Breast cancer Maternal Aunt    Past Surgical History:  Procedure Laterality Date  . ABDOMINAL HYSTERECTOMY Bilateral 07/19/2012   Procedure: HYSTERECTOMY ABDOMINAL WITH BILATERAL SALPINGECTOMY AND LYSIS OF ADHESIONS;  Surgeon: Olga Millers, MD;  Location: [facility];  Service: Gynecology;  Laterality: Bilateral;  . TUBAL LIGATION        Vanessa Kick, MD 08/26/18 1011

## 2018-10-06 ENCOUNTER — Other Ambulatory Visit: Payer: Self-pay | Admitting: Allergy

## 2018-10-06 DIAGNOSIS — J4530 Mild persistent asthma, uncomplicated: Secondary | ICD-10-CM

## 2018-10-08 ENCOUNTER — Other Ambulatory Visit: Payer: Self-pay | Admitting: Allergy

## 2018-10-08 DIAGNOSIS — J4530 Mild persistent asthma, uncomplicated: Secondary | ICD-10-CM

## 2018-10-08 DIAGNOSIS — H1010 Acute atopic conjunctivitis, unspecified eye: Secondary | ICD-10-CM

## 2018-11-25 ENCOUNTER — Encounter (HOSPITAL_COMMUNITY): Payer: Self-pay | Admitting: Emergency Medicine

## 2018-11-25 ENCOUNTER — Other Ambulatory Visit: Payer: Self-pay

## 2018-11-25 ENCOUNTER — Emergency Department (HOSPITAL_COMMUNITY)
Admission: EM | Admit: 2018-11-25 | Discharge: 2018-11-25 | Disposition: A | Discharge status: Home / Self Care | Payer: BC Managed Care – PPO | Attending: Emergency Medicine | Admitting: Emergency Medicine

## 2018-11-25 ENCOUNTER — Emergency Department (HOSPITAL_COMMUNITY): Payer: BC Managed Care – PPO

## 2018-11-25 DIAGNOSIS — R0789 Other chest pain: Secondary | ICD-10-CM | POA: Insufficient documentation

## 2018-11-25 DIAGNOSIS — I10 Essential (primary) hypertension: Secondary | ICD-10-CM | POA: Insufficient documentation

## 2018-11-25 DIAGNOSIS — Z79899 Other long term (current) drug therapy: Secondary | ICD-10-CM | POA: Diagnosis not present

## 2018-11-25 DIAGNOSIS — Z91040 Latex allergy status: Secondary | ICD-10-CM | POA: Insufficient documentation

## 2018-11-25 DIAGNOSIS — K219 Gastro-esophageal reflux disease without esophagitis: Secondary | ICD-10-CM | POA: Diagnosis not present

## 2018-11-25 DIAGNOSIS — R002 Palpitations: Secondary | ICD-10-CM | POA: Insufficient documentation

## 2018-11-25 DIAGNOSIS — R079 Chest pain, unspecified: Secondary | ICD-10-CM

## 2018-11-25 DIAGNOSIS — R0602 Shortness of breath: Secondary | ICD-10-CM | POA: Diagnosis present

## 2018-11-25 DIAGNOSIS — J45909 Unspecified asthma, uncomplicated: Secondary | ICD-10-CM | POA: Insufficient documentation

## 2018-11-25 DIAGNOSIS — F410 Panic disorder [episodic paroxysmal anxiety] without agoraphobia: Secondary | ICD-10-CM | POA: Diagnosis not present

## 2018-11-25 LAB — CBC WITH DIFFERENTIAL/PLATELET
Abs Immature Granulocytes: 0.01 10*3/uL (ref 0.00–0.07)
Basophils Absolute: 0 10*3/uL (ref 0.0–0.1)
Basophils Relative: 1 %
Eosinophils Absolute: 0.1 10*3/uL (ref 0.0–0.5)
Eosinophils Relative: 3 %
HCT: 39.6 % (ref 36.0–46.0)
Hemoglobin: 13.8 g/dL (ref 12.0–15.0)
Immature Granulocytes: 0 %
Lymphocytes Relative: 33 %
Lymphs Abs: 1.4 10*3/uL (ref 0.7–4.0)
MCH: 31.2 pg (ref 26.0–34.0)
MCHC: 34.8 g/dL (ref 30.0–36.0)
MCV: 89.4 fL (ref 80.0–100.0)
Monocytes Absolute: 0.3 10*3/uL (ref 0.1–1.0)
Monocytes Relative: 8 %
Neutro Abs: 2.3 10*3/uL (ref 1.7–7.7)
Neutrophils Relative %: 55 %
Platelets: 251 10*3/uL (ref 150–400)
RBC: 4.43 MIL/uL (ref 3.87–5.11)
RDW: 12.3 % (ref 11.5–15.5)
WBC: 4.1 10*3/uL (ref 4.0–10.5)
nRBC: 0 % (ref 0.0–0.2)

## 2018-11-25 LAB — I-STAT BETA HCG BLOOD, ED (MC, WL, AP ONLY): I-stat hCG, quantitative: <5 m[IU]/mL (ref <5)

## 2018-11-25 LAB — COMPREHENSIVE METABOLIC PANEL
ALT: 21 U/L (ref 0–44)
AST: 22 U/L (ref 15–41)
Albumin: 4 g/dL (ref 3.5–5.0)
Alkaline Phosphatase: 71 U/L (ref 38–126)
Anion gap: 9 (ref 5–15)
BUN: 17 mg/dL (ref 6–20)
CO2: 22 mmol/L (ref 22–32)
Calcium: 9 mg/dL (ref 8.9–10.3)
Chloride: 106 mmol/L (ref 98–111)
Creatinine, Ser: 0.9 mg/dL (ref 0.44–1.00)
GFR calc Af Amer: >60 mL/min (ref >60)
GFR calc non Af Amer: >60 mL/min (ref >60)
Glucose, Bld: 145 mg/dL — ABNORMAL HIGH (ref 70–99)
Potassium: 3.1 mmol/L — ABNORMAL LOW (ref 3.5–5.1)
Sodium: 137 mmol/L (ref 135–145)
Total Bilirubin: 0.8 mg/dL (ref 0.3–1.2)
Total Protein: 6.9 g/dL (ref 6.5–8.1)

## 2018-11-25 LAB — TROPONIN I (HIGH SENSITIVITY)
Troponin I (High Sensitivity): 2 ng/L (ref <18)
Troponin I (High Sensitivity): 3 ng/L (ref <18)

## 2018-11-25 MED ORDER — PANTOPRAZOLE SODIUM 40 MG PO TBEC: 40.0000 mg | DELAYED_RELEASE_TABLET | Freq: Every day | ORAL | 3 refills | Status: AC

## 2018-11-25 NOTE — ED Provider Notes (Signed)
[place] DEPT Provider Note   CSN: [id number] Arrival date & time: 11/25/18  0056     History   Chief Complaint Chief Complaint  Patient presents with  . Anxiety    HPI Andrea Bruce is a 47 y.o. female.     Patient presents to the emergency department for evaluation of panic attack.  Patient does have a history of anxiety and panic disorder.  She has a prescription for Ativan to be used as needed.  She had onset of anxiety with shortness of breath, palpitations, chest discomfort earlier tonight.  She took her Ativan but did not feel the usual improvement.  After an hour she was still having symptoms so she called EMS.  She comes to the ER by EMS.  Anxiety has resolved but she still has some discomfort in her chest that she feels is "indigestion".  She does report a history of GERD but chooses not to use any pharmaceuticals, prefers to control it with diet and papaya seeds.     Past Medical History:  Diagnosis Date  . Anxiety   . Asthma   . Eczema   . GERD (gastroesophageal reflux disease)    uses papaya seeds for heartburn  . Hypertension   . Sleep apnea    uses a CPAP    Patient Active Problem List   Diagnosis Date Noted  . Mild persistent asthma, uncomplicated [id number]  . Allergic rhinoconjunctivitis 06/16/2018  . Adverse food reaction 06/16/2018  . Other atopic dermatitis 06/16/2018    Past Surgical History:  Procedure Laterality Date  . ABDOMINAL HYSTERECTOMY Bilateral 07/19/2012   Procedure: HYSTERECTOMY ABDOMINAL WITH BILATERAL SALPINGECTOMY AND LYSIS OF ADHESIONS;  Surgeon: Olga Millers, MD;  Location: [facility];  Service: Gynecology;  Laterality: Bilateral;  . TUBAL LIGATION       OB History   No obstetric history on file.      Home Medications    Prior to Admission medications   Medication Sig Start Date End Date Taking? Authorizing Provider  albuterol (VENTOLIN HFA) 108 (90 Base) MCG/ACT inhaler INHALE 2  PUFFS INTO THE LUNGS EVERY 4 HOURS AS NEEDED FOR WHEEZING OR SHORTNESS OF BREATH Patient taking differently: Inhale 2 puffs into the lungs every 4 (four) hours as needed for wheezing or shortness of breath.  10/06/18  Yes Garnet Sierras, DO  EPINEPHrine (EPIPEN 2-PAK) 0.3 mg/0.3 mL IJ SOAJ injection EpiPen 2-Pak 0.3 mg/0.3 mL injection, auto-injector 06/16/18  Yes Garnet Sierras, DO  fluticasone [facility]) 50 MCG/ACT nasal spray Use 2 sprays per nostril each nostril daily for nasal congestion 06/16/18  Yes Garnet Sierras, DO  Fluticasone Furoate (ARNUITY ELLIPTA) 100 MCG/ACT AEPB Inhale 1 puff into the lungs daily. 06/16/18  Yes Garnet Sierras, DO  ibuprofen (ADVIL,MOTRIN) 800 MG tablet Take 1 tablet (800 mg total) by mouth 3 (three) times daily. 03/14/17  Yes Mackuen, Courteney Lyn, MD  LORazepam (ATIVAN) 0.5 MG tablet Take 0.5 mg by mouth as needed for anxiety.    Yes [provider]  metoprolol tartrate (LOPRESSOR) 25 MG tablet Take 25 mg by mouth daily.  05/06/17  Yes [provider]  montelukast (SINGULAIR) 10 MG tablet TAKE 1 TABLET BY MOUTH EVERYDAY AT BEDTIME Patient taking differently: Take 10 mg by mouth at bedtime.  10/08/18  Yes Garnet Sierras, DO  Olopatadine HCl (PAZEO) 0.7 % SOLN Apply 1 drop to eye daily as needed (for itchy eyes). 06/16/18  Yes Garnet Sierras,  DO  Potassium Chloride ER 20 MEQ TBCR Take 20 mEq by mouth daily.  03/10/17  Yes [provider]  triamcinolone cream (KENALOG) 0.1 % APPLY TO AFFECTED AREA TWICE A DAY as needed. Use below the neck no more than 2 weeks at a time. Patient taking differently: Apply 1 application topically 2 (two) times daily as needed (affected area). Use below the neck no more than 2 weeks at a time. 06/16/18  Yes Garnet Sierras, DO  azelastine (ASTELIN) 0.1 % nasal spray Take 1-2 sprays twice a day as needed for runny nose. Patient not taking: Reported on 11/25/2018 06/16/18   Garnet Sierras, DO  diclofenac (VOLTAREN) 75 MG EC tablet Take 1 tablet (75  mg total) by mouth 2 (two) times daily. Patient not taking: Reported on 11/25/2018 08/12/18   Vanessa Kick, MD  hydrochlorothiazide (HYDRODIURIL) 25 MG tablet Take 25 mg by mouth daily.  03/27/17   [provider]  pantoprazole (PROTONIX) 40 MG tablet Take 1 tablet (40 mg total) by mouth daily. 11/25/18   Orpah Greek, MD    Family History Family History  Problem Relation Age of Onset  . Hypertension Mother   . Hypertension Father   . Breast cancer Sister   . Breast cancer Maternal Aunt     Social History Social History   Tobacco Use  . Smoking status: Never Smoker  . Smokeless tobacco: Never Used  Substance Use Topics  . Alcohol use: No  . Drug use: No     Allergies   Penicillins and Latex   Review of Systems Review of Systems  Respiratory: Positive for shortness of breath.   Cardiovascular: Positive for chest pain and palpitations.  Psychiatric/Behavioral: The patient is nervous/anxious.   All other systems reviewed and are negative.    Physical Exam Updated Vital Signs BP 119/85 (BP Location: Left Arm)   Pulse 65   Temp 98.2 F (36.8 C) (Oral)   Resp 12   Ht 5\' 1"  (1.549 m)   Wt 80.3 kg   LMP 04/24/2013   SpO2 100%   BMI 33.44 kg/m   Physical Exam Vitals signs and nursing note reviewed.  Constitutional:      General: She is not in acute distress.    Appearance: Normal appearance. She is well-developed.  HENT:     Head: Normocephalic and atraumatic.     Right Ear: Hearing normal.     Left Ear: Hearing normal.     Nose: Nose normal.  Eyes:     Conjunctiva/sclera: Conjunctivae normal.     Pupils: Pupils are equal, round, and reactive to light.  Neck:     Musculoskeletal: Normal range of motion and neck supple.  Cardiovascular:     Rate and Rhythm: Regular rhythm.     Heart sounds: S1 normal and S2 normal. No murmur. No friction rub. No gallop.   Pulmonary:     Effort: Pulmonary effort is normal. No respiratory distress.      Breath sounds: Normal breath sounds.  Chest:     Chest wall: No tenderness.  Abdominal:     General: Bowel sounds are normal.     Palpations: Abdomen is soft.     Tenderness: There is no abdominal tenderness. There is no guarding or rebound. Negative signs include Murphy's sign and McBurney's sign.     Hernia: No hernia is present.  Musculoskeletal: Normal range of motion.  Skin:    General: Skin is warm and dry.  Findings: No rash.  Neurological:     Mental Status: She is alert and oriented to person, place, and time.     GCS: GCS eye subscore is 4. GCS verbal subscore is 5. GCS motor subscore is 6.     Cranial Nerves: No cranial nerve deficit.     Sensory: No sensory deficit.     Coordination: Coordination normal.  Psychiatric:        Speech: Speech normal.        Behavior: Behavior normal.        Thought Content: Thought content normal.      ED Treatments / Results  Labs (all labs ordered are listed, but only abnormal results are displayed) Labs Reviewed  COMPREHENSIVE METABOLIC PANEL - Abnormal; Notable for the following components:      Result Value   Potassium 3.1 (*)    Glucose, Bld 145 (*)    All other components within normal limits  CBC WITH DIFFERENTIAL/PLATELET  I-STAT BETA HCG BLOOD, ED ([facility], [facility], [facility] ONLY)  TROPONIN I (HIGH SENSITIVITY)  TROPONIN I (HIGH SENSITIVITY)    EKG None  Radiology Dg Chest 2 View  Result Date: 11/25/2018 CLINICAL DATA:  Chest pain EXAM: CHEST - 2 VIEW COMPARISON:  05/17/2016 FINDINGS: The heart size and mediastinal contours are within normal limits. Both lungs are clear. Mild degenerative changes of the spine. IMPRESSION: No active cardiopulmonary disease. Electronically Signed   By: Donavan Foil M.D.   On: 11/25/2018 03:50    Procedures Procedures (including critical care time)  Medications Ordered in ED Medications - No data to display   Initial Impression / Assessment and Plan / ED Course  I have reviewed the triage  vital signs and the nursing notes.  Pertinent labs & imaging results that were available during my care of the patient were reviewed by me and considered in my medical decision making (see chart for details).        Patient presents with panic attack.  She does have history of anxiety and panic disorder with similar reactions in the past.  Tonight, however, her panic attack did not resolve with Ativan like it normally does.  By the time I did evaluate her in the ER, however, symptoms had resolved.  No further anxiety or panic but was still experiencing some central chest discomfort.  She reports that she has felt this before and it has been consistent with GERD.  She has not currently on any formal treatment plan for her reflux disease.  She has low cardiac risk, has undergone evaluation here in the ER.  2 troponins are negative, patient reassured, no further evaluation necessary in the ER.  Final Clinical Impressions(s) / ED Diagnoses   Final diagnoses:  Panic attack  Gastroesophageal reflux disease, esophagitis presence not specified    ED Discharge Orders         Ordered    pantoprazole (PROTONIX) 40 MG tablet  Daily     11/25/18 0647           Orpah Greek, MD 11/25/18 [phone]

## 2018-11-25 NOTE — ED Triage Notes (Signed)
Patient brought in by [facility]. Patient complaining of anxiety. Patient woke out of her sleep and took ativan to help her calm down. Patient wanted to come to the hospital because she was not able to calm down.

## 2018-12-20 ENCOUNTER — Ambulatory Visit: Payer: BC Managed Care – PPO | Admitting: Allergy

## 2018-12-20 NOTE — Progress Notes (Deleted)
Follow Up Note  RE: Andrea Bruce MRN: [id number] DOB: May 25, 1970 Date of Office Visit: 12/20/2018  Referring provider: Heywood Bene, * Primary care provider: Heywood Bene, PA-C  Chief Complaint: No chief complaint on file.  History of Present Illness: I had the pleasure of seeing Andrea Bruce for a follow up visit at the [facility] on 12/20/2018. She is a 48 y.o. female, who is being followed for asthma, allergic rhinoconjunctivitis, adverse food reaction and atopic dermatitis. Today she is here for regular follow up visit. Her previous allergy office visit was on 06/16/2018 with Dr. Maudie Mercury.   Mild persistent asthma, uncomplicated Stopped Arnuity in October and the last few weeks noticing flare of her symptoms requiring albuterol 4-5 times a week and ran out of her inhaler.  Today's spirometry was normal.  Daily controller medication(s):start Arnuity 100 1 puff once a day and rinse mouth afterwards  Prior to physical activity:May use albuterol rescue inhaler 2 puffs 5 to 15 minutes prior to strenuous physical activities.  Rescue medications:May use albuterol rescue inhaler 2 puffs or nebulizer every 4 to 6 hours as needed for shortness of breath, chest tightness, coughing, and wheezing. Monitor frequency of use.   If symptoms do not improve advised patient to call our office as we may need to step up therapy.   Allergic rhinoconjunctivitis Past history - 2019 skin testing was positive to grass and mold.  Interim history - symptoms are flaring again. She did have good benefit last year with below regimen.  Continue environmental cortol measures.   May use over the counter antihistamines such as Zyrtec (cetirizine), Claritin (loratadine), Allegra (fexofenadine), or Xyzal (levocetirizine) daily as needed.  Start Flonase 2 sprays each nostril daily for nasal congestion. Use for 1-2 weeks at a time before stopping once symptoms improved.    For post nasal drip use Astelin 2 sprays twice a day.  Use Pazeo 1 drop each eye as needed for itchy/watery/red eyes  Continue Singulair 10mg  daily for both allergy symptoms control and asthma control  Adverse food reaction Currently avoiding soy, almond and pork. No reactions since the last visit.  Continue avoidance. If interested in reintroduction recommend getting bloodwork first.  For mild symptoms you can take over the counter antihistamines such as Benadryl and monitor symptoms closely. If symptoms worsen or if you have severe symptoms including breathing issues, throat closure, significant swelling, whole body hives, severe diarrhea and vomiting, lightheadedness then inject epinephrine and seek immediate medical care afterwards.  Other atopic dermatitis Waxes and wanes.  Continue skin care measures.  May use topical triamcinolone 0.1% cream twice a day as needed for flares below the neck up to 2 weeks at a time.   Return in about 6 months (around 12/17/2018).  Assessment and Plan: Andrea Bruce is a 48 y.o. female with: No problem-specific Assessment & Plan notes found for this encounter.  No follow-ups on file.  No orders of the defined types were placed in this encounter.  Lab Orders  No laboratory test(s) ordered today    Diagnostics: Spirometry:  Tracings reviewed. Her effort: {Blank single:19197::"Good reproducible efforts.","It was hard to get consistent efforts and there is a question as to whether this reflects a maximal maneuver.","Poor effort, data can not be interpreted."} FVC: ***L FEV1: ***L, ***% predicted FEV1/FVC ratio: ***% Interpretation: {Blank single:19197::"Spirometry consistent with mild obstructive disease","Spirometry consistent with moderate obstructive disease","Spirometry consistent with severe obstructive disease","Spirometry consistent with possible restrictive disease","Spirometry consistent with mixed obstructive  and restrictive  disease","Spirometry uninterpretable due to technique","Spirometry consistent with normal pattern","No overt abnormalities noted given today's efforts"}.  Please see scanned spirometry results for details.  Skin Testing: {Blank single:19197::"Select foods","Environmental allergy panel","Environmental allergy panel and select foods","Food allergy panel","None","Deferred due to recent antihistamines use"}. Positive test to: ***. Negative test to: ***.  Results discussed with patient/family.   Medication List:  Current Outpatient Medications  Medication Sig Dispense Refill  . albuterol (VENTOLIN HFA) 108 (90 Base) MCG/ACT inhaler INHALE 2 PUFFS INTO THE LUNGS EVERY 4 HOURS AS NEEDED FOR WHEEZING OR SHORTNESS OF BREATH (Patient taking differently: Inhale 2 puffs into the lungs every 4 (four) hours as needed for wheezing or shortness of breath. ) 18 g 1  . azelastine (ASTELIN) 0.1 % nasal spray Take 1-2 sprays twice a day as needed for runny nose. (Patient not taking: Reported on 11/25/2018) 30 mL 12  . diclofenac (VOLTAREN) 75 MG EC tablet Take 1 tablet (75 mg total) by mouth 2 (two) times daily. (Patient not taking: Reported on 11/25/2018) 14 tablet 0  . EPINEPHrine (EPIPEN 2-PAK) 0.3 mg/0.3 mL IJ SOAJ injection EpiPen 2-Pak 0.3 mg/0.3 mL injection, auto-injector 2 Device 2  . fluticasone (FLONASE) 50 MCG/ACT nasal spray Use 2 sprays per nostril each nostril daily for nasal congestion 16 g 5  . Fluticasone Furoate (ARNUITY ELLIPTA) 100 MCG/ACT AEPB Inhale 1 puff into the lungs daily. 30 each 5  . hydrochlorothiazide (HYDRODIURIL) 25 MG tablet Take 25 mg by mouth daily.     Marland Kitchen ibuprofen (ADVIL,MOTRIN) 800 MG tablet Take 1 tablet (800 mg total) by mouth 3 (three) times daily. 21 tablet 0  . LORazepam (ATIVAN) 0.5 MG tablet Take 0.5 mg by mouth as needed for anxiety.     . metoprolol tartrate (LOPRESSOR) 25 MG tablet Take 25 mg by mouth daily.     . montelukast (SINGULAIR) 10 MG tablet TAKE 1 TABLET BY  MOUTH EVERYDAY AT BEDTIME (Patient taking differently: Take 10 mg by mouth at bedtime. ) 90 tablet 1  . Olopatadine HCl (PAZEO) 0.7 % SOLN Apply 1 drop to eye daily as needed (for itchy eyes). 1 Bottle 5  . pantoprazole (PROTONIX) 40 MG tablet Take 1 tablet (40 mg total) by mouth daily. 30 tablet 3  . Potassium Chloride ER 20 MEQ TBCR Take 20 mEq by mouth daily.     Marland Kitchen triamcinolone cream (KENALOG) 0.1 % APPLY TO AFFECTED AREA TWICE A DAY as needed. Use below the neck no more than 2 weeks at a time. (Patient taking differently: Apply 1 application topically 2 (two) times daily as needed (affected area). Use below the neck no more than 2 weeks at a time.) 30 g 1   No current facility-administered medications for this visit.    Allergies: Allergies  Allergen Reactions  . Penicillins     Childhood reaction  . Latex Hives and Rash    Gloves & condoms   I reviewed her past medical history, social history, family history, and environmental history and no significant changes have been reported from previous visit on 06/16/2018.  Review of Systems  Constitutional: Negative for appetite change, chills, fever and unexpected weight change.  HENT: Positive for congestion and rhinorrhea.   Eyes: Positive for itching.  Respiratory: Positive for shortness of breath and wheezing. Negative for cough and chest tightness.   Gastrointestinal: Negative for abdominal pain.  Skin: Negative for rash.  Allergic/Immunologic: Positive for environmental allergies.  Neurological: Negative for headaches.   Objective: LMP 04/24/2013  There is no height or weight on file to calculate BMI. Physical Exam  Constitutional: She is oriented to person, place, and time. She appears well-developed and well-nourished.  HENT:  Head: Normocephalic and atraumatic.  Right Ear: External ear normal.  Left Ear: External ear normal.  Nose: Nose normal.  Mouth/Throat: Oropharynx is clear and moist.  Eyes: Conjunctivae and EOM are  normal.  Neck: Neck supple.  Cardiovascular: Normal rate, regular rhythm and normal heart sounds. Exam reveals no gallop and no friction rub.  No murmur heard. Pulmonary/Chest: Effort normal and breath sounds normal. She has no wheezes. She has no rales.  Neurological: She is alert and oriented to person, place, and time.  Skin: Skin is warm. No rash noted.  Psychiatric: She has a normal mood and affect. Her behavior is normal.  Nursing note and vitals reviewed.  Previous notes and tests were reviewed. The plan was reviewed with the patient/family, and all questions/concerned were addressed.  It was my pleasure to see Andrea Bruce today and participate in her care. Please feel free to contact me with any questions or concerns.  Sincerely,  Rexene Alberts, DO Allergy & Immunology  [facility] office: [phone] [facility] office: [place] office: [phone]

## 2019-01-31 ENCOUNTER — Other Ambulatory Visit: Payer: Self-pay | Admitting: *Deleted

## 2019-01-31 DIAGNOSIS — J4530 Mild persistent asthma, uncomplicated: Secondary | ICD-10-CM

## 2019-01-31 DIAGNOSIS — H1010 Acute atopic conjunctivitis, unspecified eye: Secondary | ICD-10-CM

## 2019-01-31 MED ORDER — MONTELUKAST SODIUM 10 MG PO TABS: 10.0000 mg | ORAL_TABLET | Freq: Every day | ORAL | 0 refills | Status: DC

## 2019-03-16 ENCOUNTER — Ambulatory Visit
Admission: EM | Admit: 2019-03-16 | Discharge: 2019-03-16 | Disposition: A | Discharge status: Home / Self Care | Payer: BC Managed Care – PPO | Attending: Physician Assistant | Admitting: Physician Assistant

## 2019-03-16 DIAGNOSIS — Z20828 Contact with and (suspected) exposure to other viral communicable diseases: Secondary | ICD-10-CM

## 2019-03-16 DIAGNOSIS — Z20822 Contact with and (suspected) exposure to covid-19: Secondary | ICD-10-CM

## 2019-03-16 DIAGNOSIS — R52 Pain, unspecified: Secondary | ICD-10-CM | POA: Diagnosis not present

## 2019-03-16 NOTE — ED Triage Notes (Signed)
Pt c/o body aches since last night. States had a positive exposure on Friday with a co-worker. States has to get tested for work

## 2019-03-16 NOTE — Discharge Instructions (Addendum)
COVID PCR testing ordered. I would like you to quarantine until testing results. Tylenol/motrin for pain/fever. You can take over the counter flonase/nasacort to help with nasal congestion/drainage. If experiencing shortness of breath, trouble breathing, go to the emergency department for further evaluation needed.

## 2019-03-16 NOTE — ED Provider Notes (Signed)
[facility]    CSN: [id number] Arrival date & time: 03/16/19  1238      History   Chief Complaint Chief Complaint  Patient presents with  . Generalized Body Aches    HPI Andrea Bruce is a 49 y.o. female.   48 year old female comes in for 2 day history of body aches and positive COVID exposure 6 days ago. States she did heavy lifting at her job 3 days ago and thought the body aches was due to heavy lifting. However, she found out she had positive COVID exposure 6 days ago and therefore came in for evaluation. Denies URI symptoms such as cough, congestion, sore throat. Denies fever, chills. Denies abdominal pain, nausea, vomiting, diarrhea. Denies shortness of breath, loss of taste/smell.      Past Medical History:  Diagnosis Date  . Anxiety   . Asthma   . Eczema   . GERD (gastroesophageal reflux disease)    uses papaya seeds for heartburn  . Hypertension   . Sleep apnea    uses a CPAP    Patient Active Problem List   Diagnosis Date Noted  . Mild persistent asthma, uncomplicated [id number]  . Allergic rhinoconjunctivitis 06/16/2018  . Adverse food reaction 06/16/2018  . Other atopic dermatitis 06/16/2018    Past Surgical History:  Procedure Laterality Date  . ABDOMINAL HYSTERECTOMY Bilateral 07/19/2012   Procedure: HYSTERECTOMY ABDOMINAL WITH BILATERAL SALPINGECTOMY AND LYSIS OF ADHESIONS;  Surgeon: Olga Millers, MD;  Location: [facility];  Service: Gynecology;  Laterality: Bilateral;  . TUBAL LIGATION      OB History   No obstetric history on file.      Home Medications    Prior to Admission medications   Medication Sig Start Date End Date Taking? Authorizing Provider  albuterol (VENTOLIN HFA) 108 (90 Base) MCG/ACT inhaler INHALE 2 PUFFS INTO THE LUNGS EVERY 4 HOURS AS NEEDED FOR WHEEZING OR SHORTNESS OF BREATH Patient taking differently: Inhale 2 puffs into the lungs every 4 (four) hours as needed for wheezing or shortness of breath.   10/06/18   Garnet Sierras, DO  azelastine (ASTELIN) 0.1 % nasal spray Take 1-2 sprays twice a day as needed for runny nose. Patient not taking: Reported on 11/25/2018 06/16/18   Garnet Sierras, DO  diclofenac (VOLTAREN) 75 MG EC tablet Take 1 tablet (75 mg total) by mouth 2 (two) times daily. Patient not taking: Reported on 11/25/2018 08/12/18   Vanessa Kick, MD  EPINEPHrine (EPIPEN 2-PAK) 0.3 mg/0.3 mL IJ SOAJ injection EpiPen 2-Pak 0.3 mg/0.3 mL injection, auto-injector 06/16/18   Garnet Sierras, DO  fluticasone Asencion Islam) 50 MCG/ACT nasal spray Use 2 sprays per nostril each nostril daily for nasal congestion 06/16/18   Garnet Sierras, DO  Fluticasone Furoate (ARNUITY ELLIPTA) 100 MCG/ACT AEPB Inhale 1 puff into the lungs daily. 06/16/18   Garnet Sierras, DO  hydrochlorothiazide (HYDRODIURIL) 25 MG tablet Take 25 mg by mouth daily.  03/27/17   [provider]  ibuprofen (ADVIL,MOTRIN) 800 MG tablet Take 1 tablet (800 mg total) by mouth 3 (three) times daily. 03/14/17   Mackuen, Courteney Lyn, MD  LORazepam (ATIVAN) 0.5 MG tablet Take 0.5 mg by mouth as needed for anxiety.     [provider]  metoprolol tartrate (LOPRESSOR) 25 MG tablet Take 25 mg by mouth daily.  05/06/17   [provider]  montelukast (SINGULAIR) 10 MG tablet Take 1 tablet (10 mg total) by mouth at bedtime. 01/31/19  Garnet Sierras, DO  Olopatadine HCl (PAZEO) 0.7 % SOLN Apply 1 drop to eye daily as needed (for itchy eyes). 06/16/18   Garnet Sierras, DO  pantoprazole (PROTONIX) 40 MG tablet Take 1 tablet (40 mg total) by mouth daily. 11/25/18   Orpah Greek, MD  Potassium Chloride ER 20 MEQ TBCR Take 20 mEq by mouth daily.  03/10/17   [provider]  triamcinolone cream (KENALOG) 0.1 % APPLY TO AFFECTED AREA TWICE A DAY as needed. Use below the neck no more than 2 weeks at a time. Patient taking differently: Apply 1 application topically 2 (two) times daily as needed (affected area). Use below the neck no more than  2 weeks at a time. 06/16/18   Garnet Sierras, DO    Family History Family History  Problem Relation Age of Onset  . Hypertension Mother   . Hypertension Father   . Breast cancer Sister   . Breast cancer Maternal Aunt     Social History Social History   Tobacco Use  . Smoking status: Never Smoker  . Smokeless tobacco: Never Used  Substance Use Topics  . Alcohol use: No  . Drug use: No     Allergies   Penicillins and Latex   Review of Systems Review of Systems  Reason unable to perform ROS: See HPI as above.     Physical Exam Triage Vital Signs ED Triage Vitals  Enc Vitals Group     BP 03/16/19 1306 123/82     Pulse Rate 03/16/19 1306 61     Resp 03/16/19 1306 18     Temp 03/16/19 1306 98 F (36.7 C)     Temp Source 03/16/19 1306 Oral     SpO2 03/16/19 1306 99 %     Weight --      Height --      Head Circumference --      Peak Flow --      Pain Score 03/16/19 1307 0     Pain Loc --      Pain Edu? --      Excl. in [place]? --    No data found.  Updated Vital Signs BP 123/82 (BP Location: Left Arm)   Pulse 61   Temp 98 F (36.7 C) (Oral)   Resp 18   LMP 04/24/2013   SpO2 99%   Physical Exam Constitutional:      General: She is not in acute distress.    Appearance: Normal appearance. She is not ill-appearing, toxic-appearing or diaphoretic.  HENT:     Head: Normocephalic and atraumatic.     Mouth/Throat:     Mouth: Mucous membranes are moist.     Pharynx: Oropharynx is clear. Uvula midline.  Cardiovascular:     Rate and Rhythm: Normal rate and regular rhythm.     Heart sounds: Normal heart sounds. No murmur. No friction rub. No gallop.   Pulmonary:     Effort: Pulmonary effort is normal. No accessory muscle usage, prolonged expiration, respiratory distress or retractions.     Comments: Lungs clear to auscultation without adventitious lung sounds. Musculoskeletal:     Cervical back: Normal range of motion and neck supple.  Neurological:     General:  No focal deficit present.     Mental Status: She is alert and oriented to person, place, and time.      UC Treatments / Results  Labs (all labs ordered are listed, but only abnormal results are displayed)  Labs Reviewed  NOVEL CORONAVIRUS, NAA    EKG   Radiology No results found.  Procedures Procedures (including critical care time)  Medications Ordered in UC Medications - No data to display  Initial Impression / Assessment and Plan / UC Course  I have reviewed the triage vital signs and the nursing notes.  Pertinent labs & imaging results that were available during my care of the patient were reviewed by me and considered in my medical decision making (see chart for details).    COVID PCR test ordered. Patient to quarantine until testing results return. No alarming signs on exam.  Patient speaking in full sentences without respiratory distress.  Symptomatic treatment discussed.  Push fluids.  Return precautions given.  Patient expresses understanding and agrees to plan.  Final Clinical Impressions(s) / UC Diagnoses   Final diagnoses:  Exposure to COVID-19 virus  Body aches   ED Prescriptions    None     PDMP not reviewed this encounter.   Ok Edwards, PA-C 03/16/19 1359

## 2019-03-18 LAB — NOVEL CORONAVIRUS, NAA: SARS-CoV-2, NAA: NOT DETECTED

## 2019-05-26 ENCOUNTER — Other Ambulatory Visit: Payer: Self-pay | Admitting: Allergy

## 2019-05-26 MED ORDER — OLOPATADINE HCL 0.2 % OP SOLN: 1.0000 [drp] | OPHTHALMIC | 0 refills | Status: DC

## 2019-05-26 NOTE — Telephone Encounter (Signed)
Pazeo eye drop changed to [place] and a courtesy refill was sent in due to a follow up appointment is needed.

## 2019-06-18 ENCOUNTER — Other Ambulatory Visit: Payer: Self-pay | Admitting: Allergy

## 2019-06-22 ENCOUNTER — Other Ambulatory Visit: Payer: Self-pay | Admitting: Allergy

## 2019-06-27 ENCOUNTER — Ambulatory Visit: Payer: BC Managed Care – PPO | Attending: Internal Medicine

## 2019-06-27 DIAGNOSIS — Z23 Encounter for immunization: Secondary | ICD-10-CM

## 2019-06-27 NOTE — Progress Notes (Signed)
   Covid-19 Vaccination Clinic  Name:  Andrea Bruce    MRN: [id number] DOB: [birth year]  06/27/2019  [name] was observed post Covid-19 immunization for 15 minutes without incident. She was provided with Vaccine Information Sheet and instruction to access the V-Safe system.   [name] was instructed to call 911 with any severe reactions post vaccine: Marland Kitchen Difficulty breathing  . Swelling of face and throat  . A fast heartbeat  . A bad rash all over body  . Dizziness and weakness   Immunizations Administered    Name Date Dose VIS Date Route   Pfizer COVID-19 Vaccine 06/27/2019  2:16 PM 0.3 mL 03/11/2019 Intramuscular   Manufacturer: [place]   Lot: [id number]   [place]: [id number]

## 2019-07-04 ENCOUNTER — Other Ambulatory Visit: Payer: Self-pay | Admitting: Obstetrics and Gynecology

## 2019-07-04 DIAGNOSIS — Z1231 Encounter for screening mammogram for malignant neoplasm of breast: Secondary | ICD-10-CM

## 2019-07-07 ENCOUNTER — Ambulatory Visit: Payer: BC Managed Care – PPO

## 2019-07-11 ENCOUNTER — Ambulatory Visit: Payer: BC Managed Care – PPO

## 2019-07-17 NOTE — Progress Notes (Signed)
Follow Up Note  RE: [name] MRN: [id number] DOB: 11-May-1970 Date of Office Visit: 07/18/2019  Referring provider: Heywood Bene, * Primary care provider: Heywood Bene, PA-C  Chief Complaint: Asthma (wheezing today) and Allergic Rhinitis   History of Present Illness: I had the pleasure of seeing Andrea Bruce for a follow up visit at the [facility] on 07/18/2019. She is a 49 y.o. female, who is being followed for asthma, allergic rhino conjunctivitis, adverse food reaction, atopic dermatitis. Her previous allergy office visit was on 06/16/2018 with Dr. Maudie Mercury. Today is a regular follow up visit. Failed to follow up due to loss of insurance.   Mild persistent asthma Patient has been having issues with wheezing, coughing and shortness of breath which started about 2 days ago. She Andrea Bruce about 2 weeks ago.  She had issues in the winter months as well. Currently on Arnuity 100 1 puff once a day with unknown benefit. Usually takes albuterol 1-2 times a week for wheezing with good benefit. Denies any ER/urgent care visits or prednisone use since the last visit.  Allergic rhinoconjunctivitis Having issues with nasal congestion and rhinorrhea. Currently using both Flonase and Astelin 1 spray BID with no benefit. No nosebleeds.  Using eye drops as needed which helps.  Still taking Bruce daily at night.   Adverse food reaction Currently avoiding soy, almond and pork. No reactions since the last visit.  Other atopic dermatitis Waxes and wanes. Mainly flares on shin area. Using topical triamcinolone with good benefit.   Assessment and Plan: Andrea Bruce is a 49 y.o. female with: Mild persistent asthma, uncomplicated Andrea out of Bruce 2 weeks ago. Noted wheezing, coughing and shortness of breath the last 2 days.   Today's spirometry was normal. ACT score 18.   Daily controller medication(s):continue Arnuity 100 1 puff once a  day and rinse mouth afterwards  Restart Bruce 10mg  daily.   Prior to physical activity:May use albuterol rescue inhaler 2 puffs 5 to 15 minutes prior to strenuous physical activities.  Rescue medications:May use albuterol rescue inhaler 2 puffs or nebulizer every 4 to 6 hours as needed for shortness of breath, chest tightness, coughing, and wheezing. Monitor frequency of use.   Allergic rhinoconjunctivitis Past history - 2019 skin testing was positive to grass and mold.  Interim history - symptoms are flaring again since off Bruce.   Continue environmental control measures.   May use over the counter antihistamines such as Zyrtec (cetirizine), Claritin (loratadine), Allegra (fexofenadine), or Xyzal (levocetirizine) daily as needed.  May use Flonase 1 spray per each nostril twice daily for nasal congestion.   For post nasal drip use Astelin 1-2 sprays per nostril twice a day.  May use olopatadine eye drops 0.2% once a day as needed for itchy/watery eyes.  Continue Bruce 10mg  daily for both allergy symptoms control and asthma control  Adverse food reaction Currently avoiding soy, peanuts, tree nuts and pork. No reactions since the last visit. Wants to get bloodwork done.  Continue avoidance. Get bloodwork. If favorable will schedule for in office food challenges next.   For mild symptoms you can take over the counter antihistamines such as Benadryl and monitor symptoms closely. If symptoms worsen or if you have severe symptoms including breathing issues, throat closure, significant swelling, whole body hives, severe diarrhea and vomiting, lightheadedness then inject epinephrine and seek immediate medical care afterwards.  Other atopic dermatitis Waxes and wanes.  Continue skin care  measures.  May use topical triamcinolone 0.1% cream twice a day as needed for eczema. Do not use on the face, neck, armpits or groin area. Do not use more than 3 weeks in a row.   Return  in about 4 months (around 11/17/2019).  Meds ordered this encounter  Medications  . albuterol (VENTOLIN HFA) 108 (90 Base) MCG/ACT inhaler    Sig: Inhale 2 puffs into the lungs every 4 (four) hours as needed for wheezing or shortness of breath (coughing).    Dispense:  18 g    Refill:  1  . Fluticasone Furoate (ARNUITY ELLIPTA) 100 MCG/ACT AEPB    Sig: Inhale 1 puff into the lungs daily. Rinse mouth after each use.    Dispense:  30 each    Refill:  5  . azelastine (ASTELIN) 0.1 % nasal spray    Sig: Take 1-2 sprays twice a day as needed for runny nose.    Dispense:  30 mL    Refill:  5  . fluticasone (FLONASE) 50 MCG/ACT nasal spray    Sig: Place 1 spray into both nostrils in the morning and at bedtime. For nasal congestion.    Dispense:  16 g    Refill:  5  . montelukast (Bruce) 10 MG tablet    Sig: Take 1 tablet (10 mg total) by mouth at bedtime.    Dispense:  90 tablet    Refill:  1  . Olopatadine HCl (PATADAY) 0.2 % SOLN    Sig: Place 1 drop into both eyes daily as needed (itchy/watery eyes).    Dispense:  2.5 mL    Refill:  5  . EPINEPHrine (EPIPEN 2-PAK) 0.3 mg/0.3 mL IJ SOAJ injection    Sig: EpiPen 2-Pak 0.3 mg/0.3 mL injection, auto-injector    Dispense:  2 each    Refill:  1  . triamcinolone cream (KENALOG) 0.1 %    Sig: Apply 1 application topically 2 (two) times daily as needed. Do not use on the face, neck, armpits or groin area. Do not use more than 3 weeks in a row.    Dispense:  30 g    Refill:  3    Lab Orders     IgE Nut Prof. w/Component Rflx     Soybean IgE     Allergen, Pork, f26  Diagnostics: Spirometry:  Tracings reviewed. Her effort: Good reproducible efforts. FVC: 2.77L FEV1: 2.39L, 112% predicted FEV1/FVC ratio: 86% Interpretation: Spirometry consistent with normal pattern.  Please see scanned spirometry results for details.  Medication List:  Current Outpatient Medications  Medication Sig Dispense Refill  . albuterol (VENTOLIN HFA)  108 (90 Base) MCG/ACT inhaler Inhale 2 puffs into the lungs every 4 (four) hours as needed for wheezing or shortness of breath (coughing). 18 g 1  . azelastine (ASTELIN) 0.1 % nasal spray Take 1-2 sprays twice a day as needed for runny nose. 30 mL 5  . diclofenac (VOLTAREN) 75 MG EC tablet Take 1 tablet (75 mg total) by mouth 2 (two) times daily. 14 tablet 0  . EPINEPHrine (EPIPEN 2-PAK) 0.3 mg/0.3 mL IJ SOAJ injection EpiPen 2-Pak 0.3 mg/0.3 mL injection, auto-injector 2 each 1  . escitalopram (LEXAPRO) 10 MG tablet Take 10 mg by mouth daily.    . fluticasone (FLONASE) 50 MCG/ACT nasal spray Place 1 spray into both nostrils in the morning and at bedtime. For nasal congestion. 16 g 5  . Fluticasone Furoate (ARNUITY ELLIPTA) 100 MCG/ACT AEPB Inhale 1 puff into the  lungs daily. Rinse mouth after each use. 30 each 5  . hydrochlorothiazide (HYDRODIURIL) 25 MG tablet Take 25 mg by mouth daily.     Marland Kitchen ibuprofen (ADVIL,MOTRIN) 800 MG tablet Take 1 tablet (800 mg total) by mouth 3 (three) times daily. 21 tablet 0  . LORazepam (ATIVAN) 0.5 MG tablet Take 0.5 mg by mouth as needed for anxiety.     . metoprolol tartrate (LOPRESSOR) 25 MG tablet Take 25 mg by mouth daily.     . montelukast (Bruce) 10 MG tablet Take 1 tablet (10 mg total) by mouth at bedtime. 90 tablet 1  . Olopatadine HCl (PATADAY) 0.2 % SOLN Place 1 drop into both eyes daily as needed (itchy/watery eyes). 2.5 mL 5  . pantoprazole (PROTONIX) 40 MG tablet Take 1 tablet (40 mg total) by mouth daily. 30 tablet 3  . Potassium Chloride ER 20 MEQ TBCR Take 20 mEq by mouth daily.     Marland Kitchen triamcinolone cream (KENALOG) 0.1 % Apply 1 application topically 2 (two) times daily as needed. Do not use on the face, neck, armpits or groin area. Do not use more than 3 weeks in a row. 30 g 3   No current facility-administered medications for this visit.   Allergies: Allergies  Allergen Reactions  . Penicillins     Childhood reaction  . Latex Hives and  Rash    Gloves & condoms   I reviewed her past medical history, social history, family history, and environmental history and no significant changes have been reported from her previous visit.  Review of Systems  Constitutional: Negative for appetite change, chills, fever and unexpected weight change.  HENT: Positive for congestion and rhinorrhea.   Eyes: Positive for itching.  Respiratory: Positive for shortness of breath and wheezing. Negative for cough and chest tightness.   Gastrointestinal: Negative for abdominal pain.  Skin: Negative for rash.  Allergic/Immunologic: Positive for environmental allergies.  Neurological: Negative for headaches.   Objective: BP 130/82 (BP Location: Left Arm, Patient Position: Sitting, Cuff Size: Normal)   Pulse 66   Temp 97.8 F (36.6 C) (Temporal)   Resp 16   Ht 5\' 1"  (1.549 m)   Wt 185 lb 12.8 oz (84.3 kg)   LMP 04/24/2013   SpO2 98%   BMI 35.11 kg/m  Body mass index is 35.11 kg/m. Physical Exam  Constitutional: She is oriented to person, place, and time. She appears well-developed and well-nourished.  HENT:  Head: Normocephalic and atraumatic.  Right Ear: External ear normal.  Left Ear: External ear normal.  Nose: Nose normal.  Mouth/Throat: Oropharynx is clear and moist.  Eyes: Conjunctivae and EOM are normal.  Cardiovascular: Normal rate, regular rhythm and normal heart sounds. Exam reveals no gallop and no friction rub.  No murmur heard. Pulmonary/Chest: Effort normal and breath sounds normal. She has no wheezes. She has no rales.  Musculoskeletal:     Cervical back: Neck supple.  Neurological: She is alert and oriented to person, place, and time.  Skin: Skin is warm. No rash noted.  Psychiatric: She has a normal mood and affect. Her behavior is normal.  Nursing note and vitals reviewed.  Previous notes and tests were reviewed. The plan was reviewed with the patient/family, and all questions/concerned were addressed.  It was  my pleasure to see Ayrian today and participate in her care. Please feel free to contact me with any questions or concerns.  Sincerely,  Rexene Alberts, DO Allergy & Immunology  [facility] [place]  [facility] office: [phone] [facility] office: [place] office: [phone]

## 2019-07-18 ENCOUNTER — Encounter: Payer: Self-pay | Admitting: Allergy

## 2019-07-18 ENCOUNTER — Ambulatory Visit: Payer: BC Managed Care – PPO | Admitting: Allergy

## 2019-07-18 ENCOUNTER — Other Ambulatory Visit: Payer: Self-pay

## 2019-07-18 VITALS — BP 130/82 | HR 66 | Temp 97.8°F | Resp 16 | Ht 61.0 in | Wt 185.8 lb

## 2019-07-18 DIAGNOSIS — L2089 Other atopic dermatitis: Secondary | ICD-10-CM

## 2019-07-18 DIAGNOSIS — J309 Allergic rhinitis, unspecified: Secondary | ICD-10-CM | POA: Diagnosis not present

## 2019-07-18 DIAGNOSIS — T781XXD Other adverse food reactions, not elsewhere classified, subsequent encounter: Secondary | ICD-10-CM

## 2019-07-18 DIAGNOSIS — J4530 Mild persistent asthma, uncomplicated: Secondary | ICD-10-CM

## 2019-07-18 DIAGNOSIS — H1010 Acute atopic conjunctivitis, unspecified eye: Secondary | ICD-10-CM

## 2019-07-18 MED ORDER — AZELASTINE HCL 0.1 % NA SOLN: NASAL | 5 refills | Status: AC

## 2019-07-18 MED ORDER — FLUTICASONE PROPIONATE 50 MCG/ACT NA SUSP: 1.0000 | Freq: Two times a day (BID) | NASAL | 5 refills | Status: AC

## 2019-07-18 MED ORDER — EPINEPHRINE 0.3 MG/0.3ML IJ SOAJ: INTRAMUSCULAR | 1 refills | Status: AC

## 2019-07-18 MED ORDER — MONTELUKAST SODIUM 10 MG PO TABS: 10.0000 mg | ORAL_TABLET | Freq: Every day | ORAL | 1 refills | Status: DC

## 2019-07-18 MED ORDER — ARNUITY ELLIPTA 100 MCG/ACT IN AEPB: 1.0000 | INHALATION_SPRAY | Freq: Every day | RESPIRATORY_TRACT | 5 refills | Status: DC

## 2019-07-18 MED ORDER — TRIAMCINOLONE ACETONIDE 0.1 % EX CREA: 1.0000 "application " | TOPICAL_CREAM | Freq: Two times a day (BID) | CUTANEOUS | 3 refills | Status: DC | PRN

## 2019-07-18 MED ORDER — ALBUTEROL SULFATE HFA 108 (90 BASE) MCG/ACT IN AERS: 2.0000 | INHALATION_SPRAY | RESPIRATORY_TRACT | 1 refills | Status: DC | PRN

## 2019-07-18 MED ORDER — OLOPATADINE HCL 0.2 % OP SOLN: 1.0000 [drp] | Freq: Every day | OPHTHALMIC | 5 refills | Status: AC | PRN

## 2019-07-18 NOTE — Assessment & Plan Note (Signed)
Past history - 2019 skin testing was positive to grass and mold.  Interim history - symptoms are flaring again since off Singulair.   Continue environmental control measures.   May use over the counter antihistamines such as Zyrtec (cetirizine), Claritin (loratadine), Allegra (fexofenadine), or Xyzal (levocetirizine) daily as needed.  May use Flonase 1 spray per each nostril twice daily for nasal congestion.   For post nasal drip use Astelin 1-2 sprays per nostril twice a day.  May use olopatadine eye drops 0.2% once a day as needed for itchy/watery eyes.  Continue Singulair 10mg  daily for both allergy symptoms control and asthma control

## 2019-07-18 NOTE — Assessment & Plan Note (Signed)
Ran out of Singulair 2 weeks ago. Noted wheezing, coughing and shortness of breath the last 2 days.   Today's spirometry was normal. ACT score 18.   Daily controller medication(s):continue Arnuity 100 1 puff once a day and rinse mouth afterwards  Restart Singulair 10mg  daily.   Prior to physical activity:May use albuterol rescue inhaler 2 puffs 5 to 15 minutes prior to strenuous physical activities.  Rescue medications:May use albuterol rescue inhaler 2 puffs or nebulizer every 4 to 6 hours as needed for shortness of breath, chest tightness, coughing, and wheezing. Monitor frequency of use.

## 2019-07-18 NOTE — Assessment & Plan Note (Signed)
Waxes and wanes.  Continue skin care measures.  May use topical triamcinolone 0.1% cream twice a day as needed for eczema. Do not use on the face, neck, armpits or groin area. Do not use more than 3 weeks in a row.

## 2019-07-18 NOTE — Patient Instructions (Addendum)
Mild persistent asthma  Daily controller medication(s):continue Arnuity 100 1 puff once a day and rinse mouth afterwards  Prior to physical activity:May use albuterol rescue inhaler 2 puffs 5 to 15 minutes prior to strenuous physical activities.  Rescue medications:May use albuterol rescue inhaler 2 puffs or nebulizer every 4 to 6 hours as needed for shortness of breath, chest tightness, coughing, and wheezing. Monitor frequency of use.  Asthma control goals:  Full participation in all desired activities (may need albuterol before activity) Albuterol use two times or less a week on average (not counting use with activity) Cough interfering with sleep two times or less a month Oral steroids no more than once a year No hospitalizations  Allergic rhinoconjunctivitis Past history - 2019 skin testing was positive to grass and mold.   Continue environmental cortol measures.   May use over the counter antihistamines such as Zyrtec (cetirizine), Claritin (loratadine), Allegra (fexofenadine), or Xyzal (levocetirizine) daily as needed.  May use Flonase 1 spray per each nostril twice daily for nasal congestion.   For post nasal drip use Astelin 1-2 sprays per nostril twice a day.  May use olopatadine eye drops 0.2% once a day as needed for itchy/watery eyes.  Continue Singulair 10mg  daily for both allergy symptoms control and asthma control  Adverse food reaction Currently avoiding soy, almond and pork.   Continue avoidance. Get bloodwork:  We are ordering labs, so please allow 1-2 weeks for the results to come back. With the newly implemented Cures Act, the labs might be visible to you at the same time that they become visible to me. However, I will not address the results until all of the results are back, so please be patient.  In the meantime, continue recommendations in your patient instructions, including avoidance measures (if applicable), until you hear from me.  For mild  symptoms you can take over the counter antihistamines such as Benadryl and monitor symptoms closely. If symptoms worsen or if you have severe symptoms including breathing issues, throat closure, significant swelling, whole body hives, severe diarrhea and vomiting, lightheadedness then inject epinephrine and seek immediate medical care afterwards.  Other atopic dermatitis  Continue skin care measures.  May use topical triamcinolone 0.1% cream twice a day as needed for eczema. Do not use on the face, neck, armpits or groin area. Do not use more than 3 weeks in a row.   Follow up in 4 months or sooner if needed.   Reducing Pollen Exposure . Pollen seasons: trees (spring), grass (summer) and ragweed/weeds (fall). Marland Kitchen Keep windows closed in your home and car to lower pollen exposure.  Susa Simmonds air conditioning in the bedroom and throughout the house if possible.  . Avoid going out in dry windy days - especially early morning. . Pollen counts are highest between 5 - 10 AM and on dry, hot and windy days.  . Save outside activities for late afternoon or after a heavy rain, when pollen levels are lower.  . Avoid mowing of grass if you have grass pollen allergy. Marland Kitchen Be aware that pollen can also be transported indoors on people and pets.  . Dry your clothes in an automatic dryer rather than hanging them outside where they might collect pollen.  . Rinse hair and eyes before bedtime.  Skin care recommendations  Bath time: . Always use lukewarm water. AVOID very hot or cold water. Marland Kitchen Keep bathing time to 5-10 minutes. . Do NOT use bubble bath. . Use a mild soap and  use just enough to wash the dirty areas. . Do NOT scrub skin vigorously.  . After bathing, pat dry your skin with a towel. Do NOT rub or scrub the skin.  Moisturizers and prescriptions:  . ALWAYS apply moisturizers immediately after bathing (within 3 minutes). This helps to lock-in moisture. . Use the moisturizer several times a day over  the whole body. Kermit Balo summer moisturizers include: Aveeno, CeraVe, Cetaphil. Kermit Balo winter moisturizers include: Aquaphor, Vaseline, Cerave, Cetaphil, Eucerin, Vanicream. . When using moisturizers along with medications, the moisturizer should be applied about one hour after applying the medication to prevent diluting effect of the medication or moisturize around where you applied the medications. When not using medications, the moisturizer can be continued twice daily as maintenance.  Laundry and clothing: . Avoid laundry products with added color or perfumes. . Use unscented hypo-allergenic laundry products such as Tide free, Cheer free & gentle, and All free and clear.  . If the skin still seems dry or sensitive, you can try double-rinsing the clothes. . Avoid tight or scratchy clothing such as wool. . Do not use fabric softeners or dyer sheets.

## 2019-07-18 NOTE — Assessment & Plan Note (Addendum)
Currently avoiding soy, peanuts, tree nuts and pork. No reactions since the last visit. Wants to get bloodwork done.  Continue avoidance. Get bloodwork. If favorable will schedule for in office food challenges next.   For mild symptoms you can take over the counter antihistamines such as Benadryl and monitor symptoms closely. If symptoms worsen or if you have severe symptoms including breathing issues, throat closure, significant swelling, whole body hives, severe diarrhea and vomiting, lightheadedness then inject epinephrine and seek immediate medical care afterwards.

## 2019-07-22 LAB — IGE NUT PROF. W/COMPONENT RFLX
F017-IgE Hazelnut (Filbert): <0.1 kU/L
F018-IgE Brazil Nut: <0.1 kU/L
F020-IgE Almond: <0.1 kU/L
F202-IgE Cashew Nut: <0.1 kU/L
F203-IgE Pistachio Nut: <0.1 kU/L
F256-IgE Walnut: <0.1 kU/L
Macadamia Nut, IgE: <0.1 kU/L
Peanut, IgE: <0.1 kU/L
Pecan Nut IgE: <0.1 kU/L

## 2019-07-22 LAB — ALLERGEN, PORK, F26: Pork IgE: <0.1 kU/L

## 2019-07-22 LAB — ALLERGEN SOYBEAN: Soybean IgE: <0.1 kU/L

## 2019-11-20 NOTE — Progress Notes (Signed)
Follow Up Note  RE: Andrea Bruce MRN: [id number] DOB: 1970-11-09 Date of Office Visit: 11/21/2019  Referring provider: Heywood Bene, * Primary care provider: Heywood Bene, PA-C  Chief Complaint: Follow-up and Asthma (no complaints )  History of Present Illness: I had the pleasure of seeing Andrea Bruce for a follow up visit at the [facility] on 11/21/2019. She is a 49 y.o. female, who is being followed for asthma, allergic rhinoconjunctivitis, adverse food reaction and atopic dermatitis. Her previous allergy office visit was on 07/18/2019 with Dr. Maudie Mercury. Today is a regular follow up visit.  Mild persistent asthma  Using albuterol at night due to the humidity which is triggering her coughing. This occurs about twice a week and the albuterol does help.  Patient ran out of Arnuity about 1 week ago.   Otherwise denies any SOB, wheezing, chest tightness, nocturnal awakenings, ER/urgent care visits or prednisone use since the last visit.  Still taking Singulair daily.   Allergic rhinoconjunctivitis Using Singulair only. Only using nasal sprays and eye drops if needed.   Adverse food reaction Currently avoiding soy, almond and pork.  Bloodwork was negative but not interested in food challenge at this times.  Other atopic dermatitis Waxes and wanes.  Assessment and Plan: Samon is a 49 y.o. female with: Mild persistent asthma, uncomplicated Ran out of Arnuity 1 week ago. Humidity triggering coughing at night. Using albuterol twice a week.   Today's spirometry was normal.  Daily controller medication(s):continue Arnuity 177mcg 1 puff once a day and rinse mouth afterwards.  Continue Singulair 10mg  daily.   Prior to physical activity:May use albuterol rescue inhaler 2 puffs 5 to 15 minutes prior to strenuous physical activities.  Rescue medications:May use albuterol rescue inhaler 2 puffs or nebulizer every 4 to 6 hours as needed for  shortness of breath, chest tightness, coughing, and wheezing. Monitor frequency of use.   Allergic rhinoconjunctivitis Past history - 2019 skin testing was positive to grass and mold.  Interim history - stable with Singulair only.   Continue environmental cortol measures.   May use over the counter antihistamines such as Zyrtec (cetirizine), Claritin (loratadine), Allegra (fexofenadine), or Xyzal (levocetirizine) daily as needed.  May use Flonase 1 spray per each nostril twice daily for nasal congestion.   Nasal saline spray (i.e., Simply Saline) or nasal saline lavage (i.e., NeilMed) is recommended as needed and prior to medicated nasal sprays.  For post nasal drip use Astelin 1-2 sprays per nostril twice a day.  May use olopatadine eye drops 0.2% once a day as needed for itchy/watery eyes.  Continue Singulair 10mg  daily for both allergy symptoms control and asthma control.  Adverse food reaction No reactions since the last visit. 2021 bloodwork negative to soy, pork, peanuts and tree nuts. Patient not interested in food challenges at this time.  Continue avoiding soy, almond and pork.   For mild symptoms you can take over the counter antihistamines such as Benadryl and monitor symptoms closely. If symptoms worsen or if you have severe symptoms including breathing issues, throat closure, significant swelling, whole body hives, severe diarrhea and vomiting, lightheadedness then inject epinephrine and seek immediate medical care afterwards.  Other atopic dermatitis Waxes and wanes.  Continue skin care measures.  May use topical triamcinolone 0.1% cream twice a day as needed for eczema. Do not use on the face, neck, armpits or groin area. Do not use more than 3 weeks in a row.   Return  in about 4 months (around 03/22/2020).  Meds ordered this encounter  Medications  . albuterol (VENTOLIN HFA) 108 (90 Base) MCG/ACT inhaler    Sig: Inhale 2 puffs into the lungs every 4 (four) hours  as needed for wheezing or shortness of breath (coughing).    Dispense:  18 g    Refill:  1  . montelukast (SINGULAIR) 10 MG tablet    Sig: Take 1 tablet (10 mg total) by mouth at bedtime.    Dispense:  90 tablet    Refill:  1  . Fluticasone Furoate (ARNUITY ELLIPTA) 100 MCG/ACT AEPB    Sig: Inhale 1 puff into the lungs daily. Rinse mouth after each use.    Dispense:  30 each    Refill:  5   Diagnostics: Spirometry:  Tracings reviewed. Her effort: Good reproducible efforts. FVC: 2.78L FEV1: 2.19L, 103% predicted FEV1/FVC ratio: 79% Interpretation: Spirometry consistent with normal pattern.  Please see scanned spirometry results for details.  Medication List:  Current Outpatient Medications  Medication Sig Dispense Refill  . albuterol (VENTOLIN HFA) 108 (90 Base) MCG/ACT inhaler Inhale 2 puffs into the lungs every 4 (four) hours as needed for wheezing or shortness of breath (coughing). 18 g 1  . azelastine (ASTELIN) 0.1 % nasal spray Take 1-2 sprays twice a day as needed for runny nose. 30 mL 5  . diclofenac (VOLTAREN) 75 MG EC tablet Take 1 tablet (75 mg total) by mouth 2 (two) times daily. 14 tablet 0  . EPINEPHrine (EPIPEN 2-PAK) 0.3 mg/0.3 mL IJ SOAJ injection EpiPen 2-Pak 0.3 mg/0.3 mL injection, auto-injector 2 each 1  . escitalopram (LEXAPRO) 10 MG tablet Take 10 mg by mouth daily.    . fluticasone (FLONASE) 50 MCG/ACT nasal spray Place 1 spray into both nostrils in the morning and at bedtime. For nasal congestion. 16 g 5  . Fluticasone Furoate (ARNUITY ELLIPTA) 100 MCG/ACT AEPB Inhale 1 puff into the lungs daily. Rinse mouth after each use. 30 each 5  . hydrochlorothiazide (HYDRODIURIL) 25 MG tablet Take 25 mg by mouth daily.     Marland Kitchen ibuprofen (ADVIL,MOTRIN) 800 MG tablet Take 1 tablet (800 mg total) by mouth 3 (three) times daily. 21 tablet 0  . LORazepam (ATIVAN) 0.5 MG tablet Take 0.5 mg by mouth as needed for anxiety.     . metoprolol tartrate (LOPRESSOR) 25 MG tablet Take  25 mg by mouth daily.     . montelukast (SINGULAIR) 10 MG tablet Take 1 tablet (10 mg total) by mouth at bedtime. 90 tablet 1  . Olopatadine HCl (PATADAY) 0.2 % SOLN Place 1 drop into both eyes daily as needed (itchy/watery eyes). 2.5 mL 5  . pantoprazole (PROTONIX) 40 MG tablet Take 1 tablet (40 mg total) by mouth daily. 30 tablet 3  . Potassium Chloride ER 20 MEQ TBCR Take 20 mEq by mouth daily.     Marland Kitchen triamcinolone cream (KENALOG) 0.1 % Apply 1 application topically 2 (two) times daily as needed. Do not use on the face, neck, armpits or groin area. Do not use more than 3 weeks in a row. 30 g 3   No current facility-administered medications for this visit.   Allergies: Allergies  Allergen Reactions  . Penicillins     Childhood reaction  . Latex Hives and Rash    Gloves & condoms   I reviewed her past medical history, social history, family history, and environmental history and no significant changes have been reported from her previous visit.  Review of Systems  Constitutional: Negative for appetite change, chills, fever and unexpected weight change.  HENT: Negative for congestion and rhinorrhea.   Eyes: Negative for itching.  Respiratory: Positive for cough. Negative for chest tightness, shortness of breath and wheezing.   Gastrointestinal: Negative for abdominal pain.  Skin: Positive for rash.  Allergic/Immunologic: Positive for environmental allergies.  Neurological: Negative for headaches.   Objective: BP 126/80   Pulse 89   Temp 98.1 F (36.7 C) (Temporal)   Resp 16   Wt 195 lb (88.5 kg)   LMP 04/24/2013   SpO2 97%   BMI 36.84 kg/m  Body mass index is 36.84 kg/m. Physical Exam Vitals and nursing note reviewed.  Constitutional:      Appearance: Normal appearance. She is well-developed.  HENT:     Head: Normocephalic and atraumatic.     Right Ear: Tympanic membrane and external ear normal.     Left Ear: Tympanic membrane and external ear normal.     Nose: Nose  normal.     Mouth/Throat:     Mouth: Mucous membranes are moist.     Pharynx: Oropharynx is clear.  Eyes:     Conjunctiva/sclera: Conjunctivae normal.  Cardiovascular:     Rate and Rhythm: Normal rate and regular rhythm.     Heart sounds: Normal heart sounds. No murmur heard.  No friction rub. No gallop.   Pulmonary:     Effort: Pulmonary effort is normal.     Breath sounds: Normal breath sounds. No wheezing or rales.  Musculoskeletal:     Cervical back: Neck supple.  Skin:    General: Skin is warm.     Findings: Rash present.     Comments: Mild erythema on anterior shin area.   Neurological:     Mental Status: She is alert and oriented to person, place, and time.  Psychiatric:        Behavior: Behavior normal.    Previous notes and tests were reviewed. The plan was reviewed with the patient/family, and all questions/concerned were addressed.  It was my pleasure to see Caysie today and participate in her care. Please feel free to contact me with any questions or concerns.  Sincerely,  Rexene Alberts, DO Allergy & Immunology  [facility] office: [phone] [facility] office: [place] office: [phone]

## 2019-11-21 ENCOUNTER — Ambulatory Visit: Payer: BC Managed Care – PPO | Admitting: Allergy

## 2019-11-21 ENCOUNTER — Encounter: Payer: Self-pay | Admitting: Allergy

## 2019-11-21 ENCOUNTER — Other Ambulatory Visit: Payer: Self-pay

## 2019-11-21 VITALS — BP 126/80 | HR 89 | Temp 98.1°F | Resp 16 | Wt 195.0 lb

## 2019-11-21 DIAGNOSIS — J309 Allergic rhinitis, unspecified: Secondary | ICD-10-CM | POA: Diagnosis not present

## 2019-11-21 DIAGNOSIS — H1010 Acute atopic conjunctivitis, unspecified eye: Secondary | ICD-10-CM

## 2019-11-21 DIAGNOSIS — T781XXD Other adverse food reactions, not elsewhere classified, subsequent encounter: Secondary | ICD-10-CM | POA: Diagnosis not present

## 2019-11-21 DIAGNOSIS — L2089 Other atopic dermatitis: Secondary | ICD-10-CM | POA: Diagnosis not present

## 2019-11-21 DIAGNOSIS — J4530 Mild persistent asthma, uncomplicated: Secondary | ICD-10-CM | POA: Diagnosis not present

## 2019-11-21 MED ORDER — MONTELUKAST SODIUM 10 MG PO TABS: 10.0000 mg | ORAL_TABLET | Freq: Every day | ORAL | 1 refills | Status: DC

## 2019-11-21 MED ORDER — ALBUTEROL SULFATE HFA 108 (90 BASE) MCG/ACT IN AERS: 2.0000 | INHALATION_SPRAY | RESPIRATORY_TRACT | 1 refills | Status: DC | PRN

## 2019-11-21 MED ORDER — ARNUITY ELLIPTA 100 MCG/ACT IN AEPB: 1.0000 | INHALATION_SPRAY | Freq: Every day | RESPIRATORY_TRACT | 5 refills | Status: DC

## 2019-11-21 NOTE — Assessment & Plan Note (Signed)
Past history - 2019 skin testing was positive to grass and mold.  Interim history - stable with Singulair only.   Continue environmental cortol measures.   May use over the counter antihistamines such as Zyrtec (cetirizine), Claritin (loratadine), Allegra (fexofenadine), or Xyzal (levocetirizine) daily as needed.  May use Flonase 1 spray per each nostril twice daily for nasal congestion.   Nasal saline spray (i.e., Simply Saline) or nasal saline lavage (i.e., NeilMed) is recommended as needed and prior to medicated nasal sprays.  For post nasal drip use Astelin 1-2 sprays per nostril twice a day.  May use olopatadine eye drops 0.2% once a day as needed for itchy/watery eyes.  Continue Singulair 10mg  daily for both allergy symptoms control and asthma control.

## 2019-11-21 NOTE — Assessment & Plan Note (Addendum)
Stable.  Continue skin care measures.  May use topical triamcinolone 0.1% cream twice a day as needed for eczema. Do not use on the face, neck, armpits or groin area. Do not use more than 3 weeks in a row.

## 2019-11-21 NOTE — Patient Instructions (Addendum)
Mild persistent asthma  Daily controller medication(s):continue Arnuity 196mcg 1 puff once a day and rinse mouth afterwards  Prior to physical activity:May use albuterol rescue inhaler 2 puffs 5 to 15 minutes prior to strenuous physical activities.  Rescue medications:May use albuterol rescue inhaler 2 puffs or nebulizer every 4 to 6 hours as needed for shortness of breath, chest tightness, coughing, and wheezing. Monitor frequency of use.  Asthma control goals:  Full participation in all desired activities (may need albuterol before activity) Albuterol use two times or less a week on average (not counting use with activity) Cough interfering with sleep two times or less a month Oral steroids no more than once a year No hospitalizations  Allergic rhinoconjunctivitis Past history - 2019 skin testing was positive to grass and mold.   Continue environmental cortol measures.   May use over the counter antihistamines such as Zyrtec (cetirizine), Claritin (loratadine), Allegra (fexofenadine), or Xyzal (levocetirizine) daily as needed.  May use Flonase 1 spray per each nostril twice daily for nasal congestion.   Nasal saline spray (i.e., Simply Saline) or nasal saline lavage (i.e., NeilMed) is recommended as needed and prior to medicated nasal sprays.  For post nasal drip use Astelin 1-2 sprays per nostril twice a day.  May use olopatadine eye drops 0.2% once a day as needed for itchy/watery eyes.  Continue Singulair 10mg  daily for both allergy symptoms control and asthma control  Adverse food reaction  Continue avoiding soy, almond and pork.   For mild symptoms you can take over the counter antihistamines such as Benadryl and monitor symptoms closely. If symptoms worsen or if you have severe symptoms including breathing issues, throat closure, significant swelling, whole body hives, severe diarrhea and vomiting, lightheadedness then inject epinephrine and seek immediate medical care  afterwards.  Other atopic dermatitis  Continue skin care measures.  May use topical triamcinolone 0.1% cream twice a day as needed for eczema. Do not use on the face, neck, armpits or groin area. Do not use more than 3 weeks in a row.   Follow up in 4 months or sooner if needed.   Reducing Pollen Exposure . Pollen seasons: trees (spring), grass (summer) and ragweed/weeds (fall). Marland Kitchen Keep windows closed in your home and car to lower pollen exposure.  Susa Simmonds air conditioning in the bedroom and throughout the house if possible.  . Avoid going out in dry windy days - especially early morning. . Pollen counts are highest between 5 - 10 AM and on dry, hot and windy days.  . Save outside activities for late afternoon or after a heavy rain, when pollen levels are lower.  . Avoid mowing of grass if you have grass pollen allergy. Marland Kitchen Be aware that pollen can also be transported indoors on people and pets.  . Dry your clothes in an automatic dryer rather than hanging them outside where they might collect pollen.  . Rinse hair and eyes before bedtime.  Skin care recommendations  Bath time: . Always use lukewarm water. AVOID very hot or cold water. Marland Kitchen Keep bathing time to 5-10 minutes. . Do NOT use bubble bath. . Use a mild soap and use just enough to wash the dirty areas. . Do NOT scrub skin vigorously.  . After bathing, pat dry your skin with a towel. Do NOT rub or scrub the skin.  Moisturizers and prescriptions:  . ALWAYS apply moisturizers immediately after bathing (within 3 minutes). This helps to lock-in moisture. . Use the moisturizer several times  a day over the whole body. Kermit Balo summer moisturizers include: Aveeno, CeraVe, Cetaphil. Kermit Balo winter moisturizers include: Aquaphor, Vaseline, Cerave, Cetaphil, Eucerin, Vanicream. . When using moisturizers along with medications, the moisturizer should be applied about one hour after applying the medication to prevent diluting effect of  the medication or moisturize around where you applied the medications. When not using medications, the moisturizer can be continued twice daily as maintenance.  Laundry and clothing: . Avoid laundry products with added color or perfumes. . Use unscented hypo-allergenic laundry products such as Tide free, Cheer free & gentle, and All free and clear.  . If the skin still seems dry or sensitive, you can try double-rinsing the clothes. . Avoid tight or scratchy clothing such as wool. . Do not use fabric softeners or dyer sheets.

## 2019-11-21 NOTE — Assessment & Plan Note (Signed)
Ran out of Arnuity 1 week ago. Humidity triggering coughing at night. Using albuterol twice a week.   Today's spirometry was normal.  Daily controller medication(s):continue Arnuity 160mcg 1 puff once a day and rinse mouth afterwards.  Continue Singulair 10mg  daily.   Prior to physical activity:May use albuterol rescue inhaler 2 puffs 5 to 15 minutes prior to strenuous physical activities.  Rescue medications:May use albuterol rescue inhaler 2 puffs or nebulizer every 4 to 6 hours as needed for shortness of breath, chest tightness, coughing, and wheezing. Monitor frequency of use.

## 2019-11-21 NOTE — Assessment & Plan Note (Signed)
No reactions since the last visit. 2021 bloodwork negative to soy, pork, peanuts and tree nuts. Patient not interested in food challenges at this time.  Continue avoiding soy, almond and pork.   For mild symptoms you can take over the counter antihistamines such as Benadryl and monitor symptoms closely. If symptoms worsen or if you have severe symptoms including breathing issues, throat closure, significant swelling, whole body hives, severe diarrhea and vomiting, lightheadedness then inject epinephrine and seek immediate medical care afterwards.

## 2020-05-07 ENCOUNTER — Other Ambulatory Visit: Payer: Self-pay | Admitting: Allergy

## 2020-07-11 IMAGING — CR CHEST - 2 VIEW
2 series · 2 of 2 positions shown · non-contrast
Comparison: 05/17/2016

CLINICAL DATA: Chest pain

EXAM:
CHEST - 2 VIEW

[w chest pa]
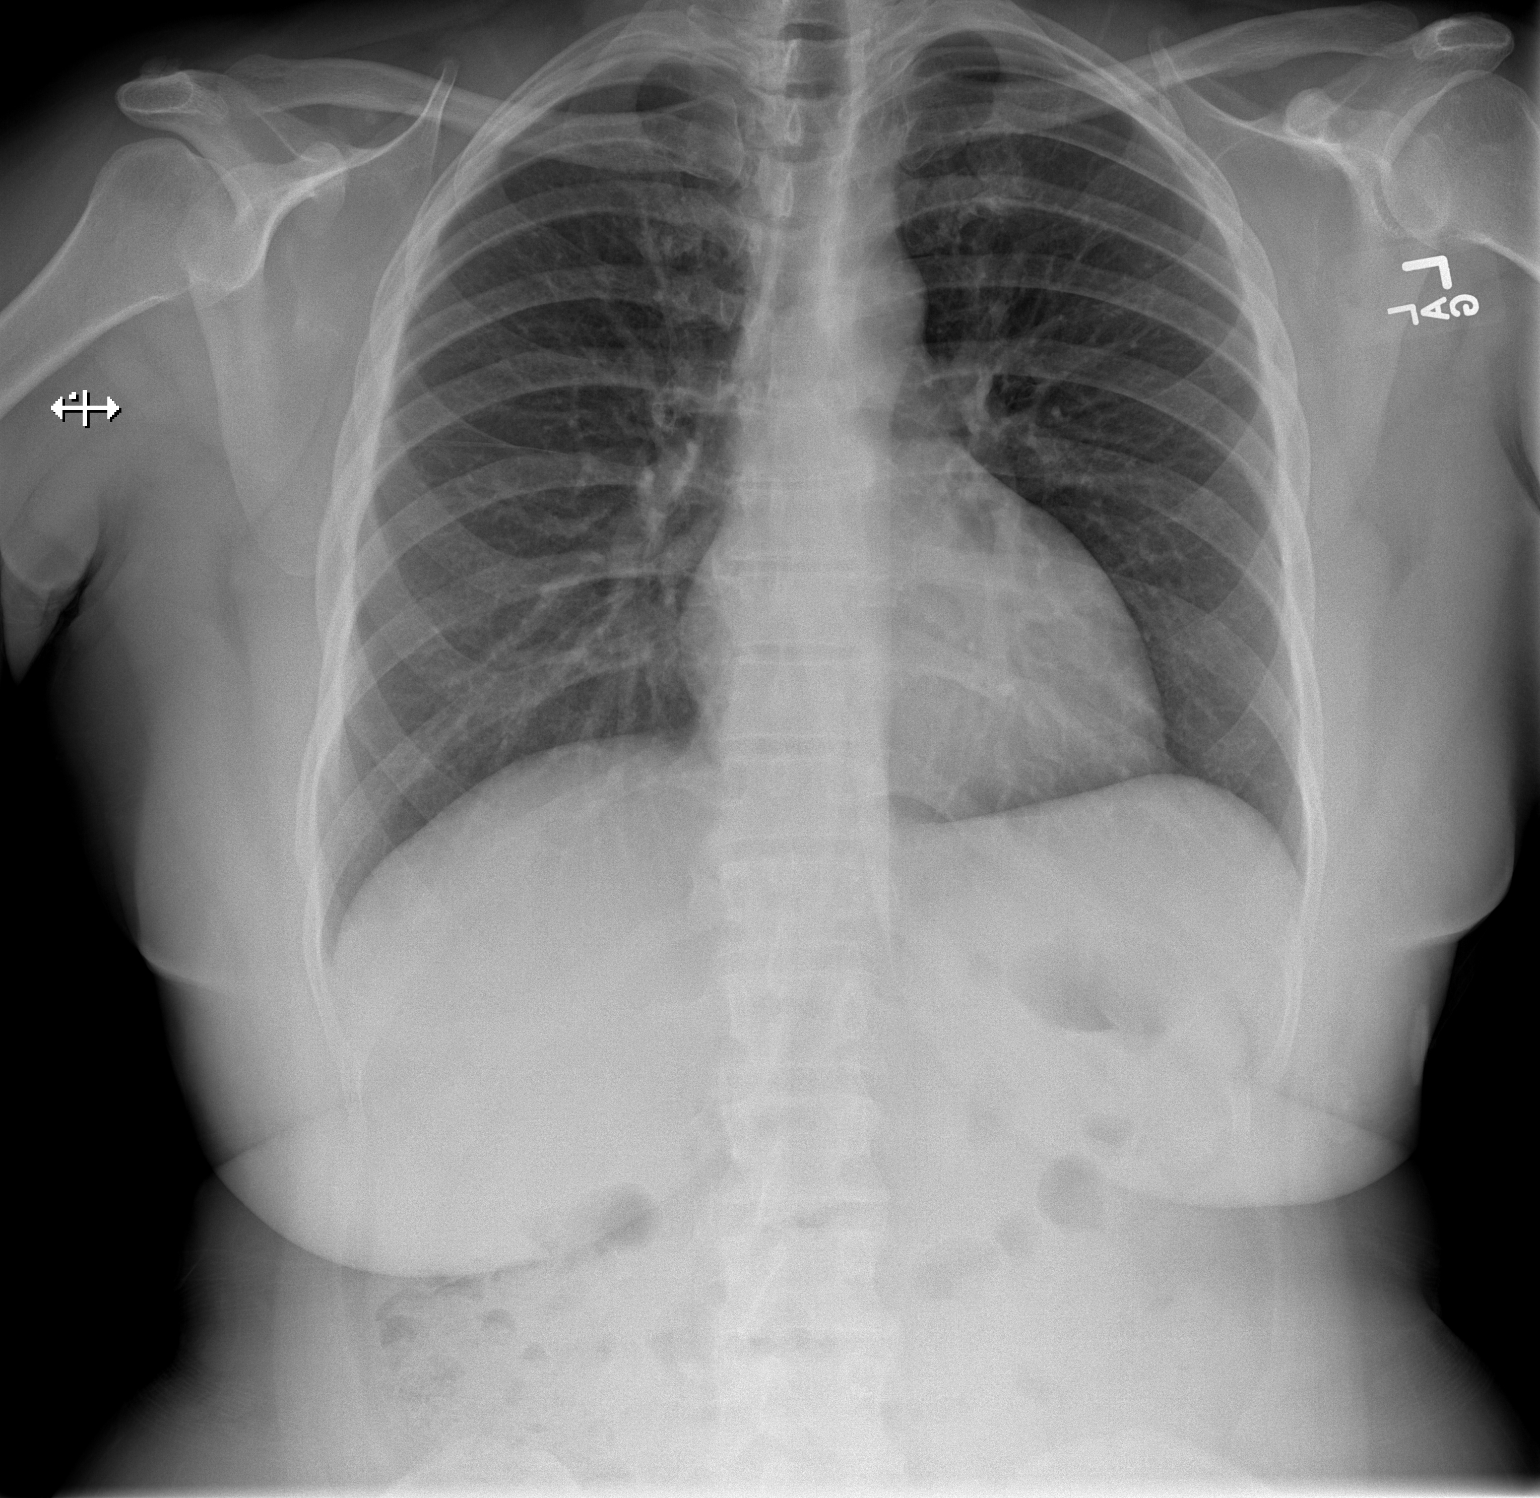

[w chest lat]
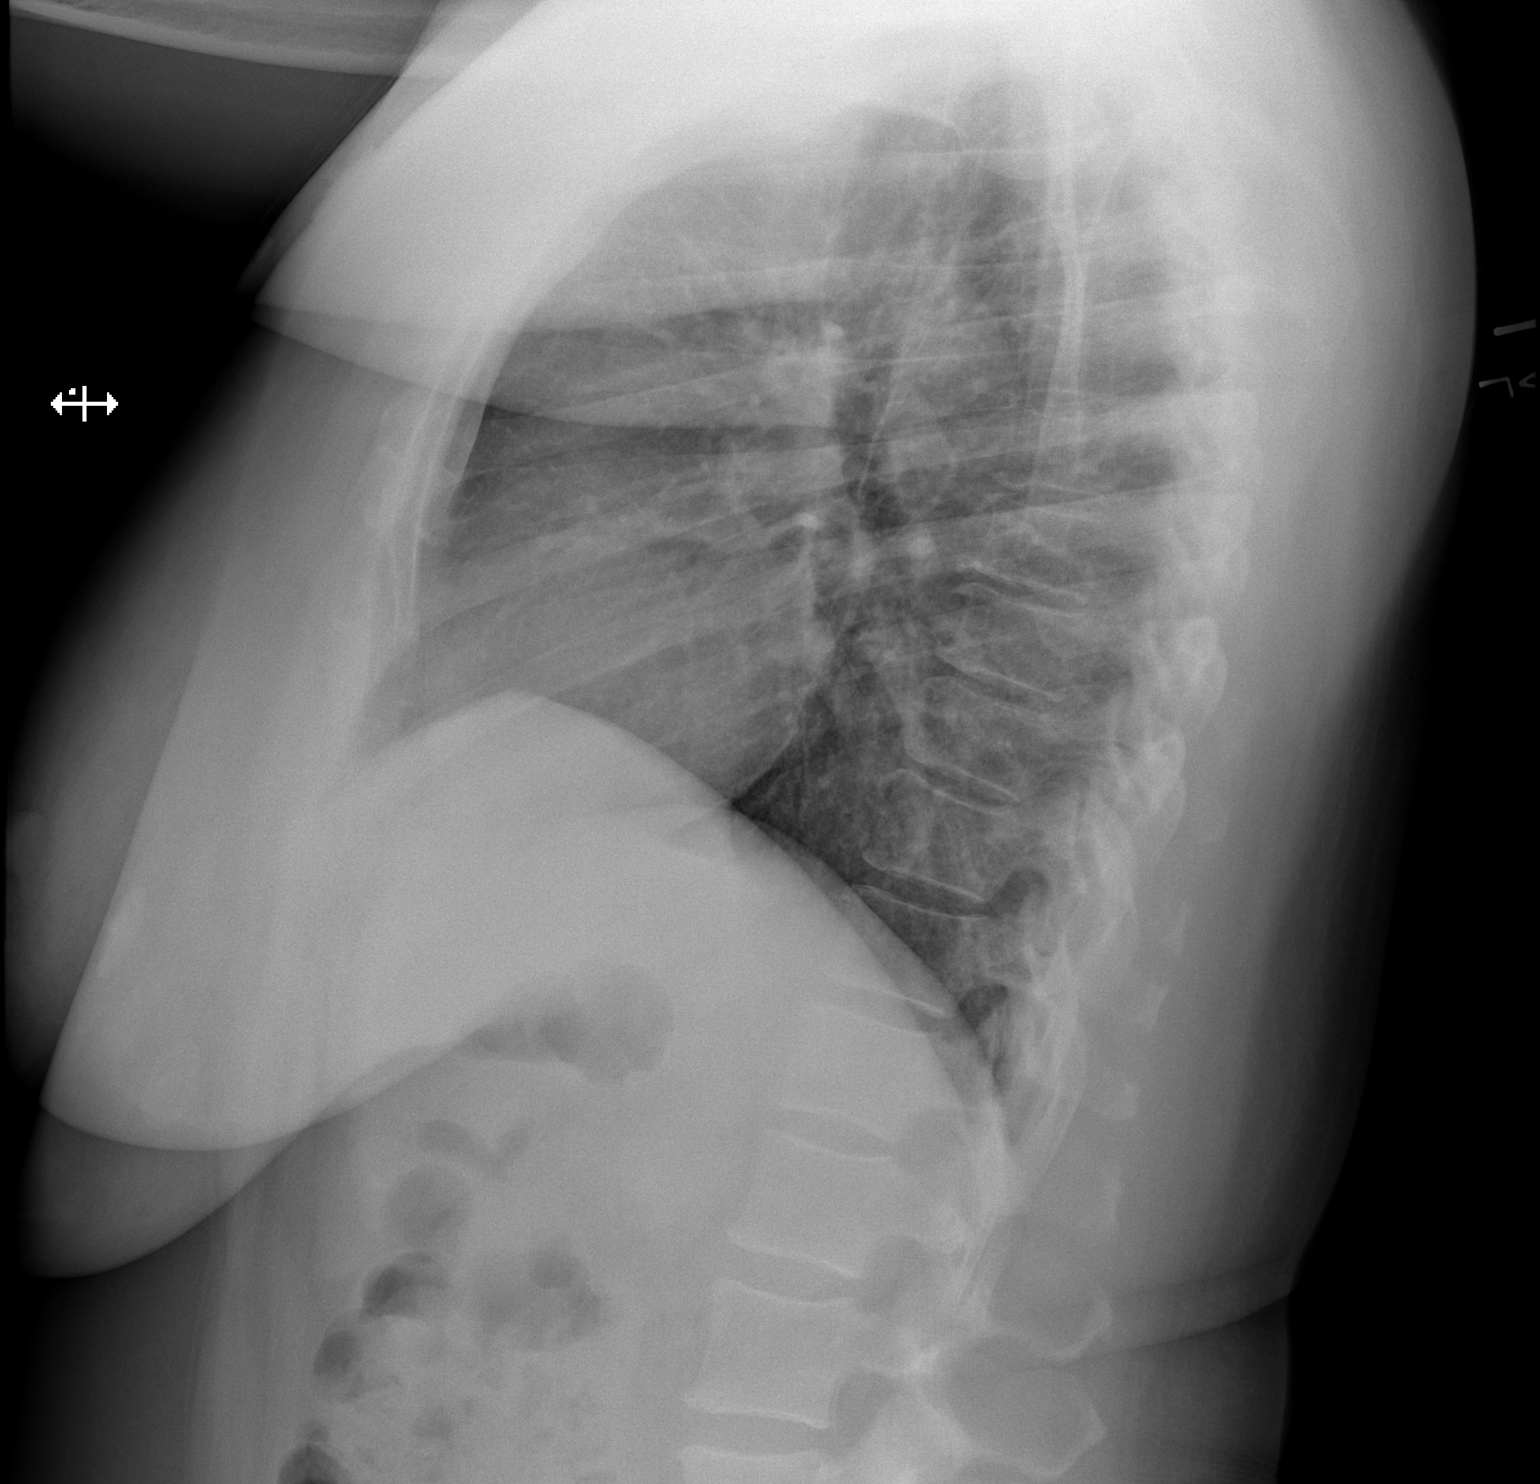

[2 of 2 positions shown; findings below may reference images not displayed]

FINDINGS: The heart size and mediastinal contours are within normal limits.
Both lungs are clear. Mild degenerative changes of the spine.
IMPRESSION: No active cardiopulmonary disease.

## 2021-01-28 ENCOUNTER — Telehealth: Payer: Self-pay | Admitting: Family Medicine

## 2021-01-28 ENCOUNTER — Telehealth: Payer: Self-pay

## 2021-01-28 MED ORDER — ALBUTEROL SULFATE HFA 108 (90 BASE) MCG/ACT IN AERS: INHALATION_SPRAY | RESPIRATORY_TRACT | 0 refills | Status: DC

## 2021-01-28 NOTE — Telephone Encounter (Signed)
Patient is requesting a courtesy refill for her albuterol inhaler. Patient scheduled appointment with Webb Silversmith 01/31/21. Patient has been made aware if unable to keep appointment we would not be able to send another refill in after courtesy refill.  [facility] - [address] [place], [place] [address]  Best contact number: [phone]

## 2021-01-28 NOTE — Telephone Encounter (Signed)
Sent to the incorrect pharmacy so I resent to the correct

## 2021-01-28 NOTE — Telephone Encounter (Signed)
Sent in courtesy refill into the [facility] in [place]. Notified patient.   Penni Bombard [phone]

## 2021-01-30 NOTE — Progress Notes (Deleted)
   [address] [place] [address] Dept: [phone]  FOLLOW UP NOTE  Patient ID: Andrea Bruce, female    DOB: March 01, 1971  Age: 50 y.o. MRN: [id number] Date of Office Visit: 01/31/2021  Assessment  Chief Complaint: No chief complaint on file.  HPI Andrea Bruce is a 50 year old female who presents the clinic for follow-up visit.  She was last seen in this clinic on 11/21/2019 by Dr. Maudie Mercury for evaluation of asthma, allergic rhinitis, allergic conjunctivitis, atopic dermatitis, and food allergy to soy, amond and pork.  Her last environmental skin testing was on 05/07/2017 and was positive to grass and mold.   Her last skin testing Food was on 05/07/2017 was negative to soy, almond, and pork   Drug Allergies:  Allergies  Allergen Reactions   Penicillins     Childhood reaction   Latex Hives and Rash    Gloves & condoms    Physical Exam: LMP 04/24/2013    Physical Exam  Diagnostics:    Assessment and Plan: No diagnosis found.  No orders of the defined types were placed in this encounter.   There are no Patient Instructions on file for this visit.  No follow-ups on file.    Thank you for the opportunity to care for this patient.  Please do not hesitate to contact me with questions.  Gareth Morgan, FNP [facility]

## 2021-01-30 NOTE — Patient Instructions (Incomplete)
Asthma Continue montelukast 10 mg once a day to prevent cough or wheeze Continue Arnuity 100-1 puff once a day to prevent cough or wheeze Continue albuterol 2 puffs once every 4 hours as needed for cough or wheeze You may use albuterol 2 puffs 5 to 15 minutes before activity to decrease cough or wheeze  Allergic rhinitis Continue allergen avoidance measures directed toward grass and mold as listed below Continue azelastine 2 sprays in each nostril twice a day as needed for runny nose or drainage Continue Flonase 2 sprays in each nostril once a day as needed for stuffy nose Consider saline nasal rinses as needed for nasal symptoms. Use this before any medicated nasal sprays for best result  Allergic conjunctivitis Some over the counter eye drops include Pataday one drop in each eye once a day as needed for red, itchy eyes OR Zaditor one drop in each eye twice a day as needed for red itchy eyes.  Atopic dermatitis Continue a twice a day moisturizing routine Continue triamcinolone 0.1% cream to red and itchy areas below your face.  Do not use this medication for longer than 3 weeks in a row  Reflux Continue dietary lifestyle modifications as listed below Continue pantoprazole as previously prescribed  Food allergy Continue to avoid soy, almond, and pork.  In case of an allergic reaction, take Benadryl 50 mg every 4 hours, and if life-threatening symptoms occur, inject with EpiPen 0.3 mg.  Call the clinic if this treatment plan is not working well for you.  Follow up in 6 months or sooner if needed.

## 2021-01-31 ENCOUNTER — Ambulatory Visit: Payer: BC Managed Care – PPO | Admitting: Family Medicine

## 2021-01-31 DIAGNOSIS — J309 Allergic rhinitis, unspecified: Secondary | ICD-10-CM

## 2021-02-06 NOTE — Progress Notes (Signed)
Follow Up Note  RE: Andrea Bruce MRN: [id number] DOB: 09-15-1970 Date of Office Visit: 02/07/2021  Referring provider: Heywood Bene, * Primary care provider: Heywood Bene, PA-C  Chief Complaint: Asthma (Had bronchitis it started 1 week 1/2 ago. She did pick up the prednisone and albuterol inhaler. It helped a little went Saturday to urgent care and was given doxycycline. She can tell the difference in her breathing.) and Eczema (Has been having flares needs refills )  History of Present Illness: I had the pleasure of seeing Andrea Bruce for a follow up visit at the [facility] [place] on 02/07/2021. She is a 50 y.o. female, who is being followed for asthma, allergic rhinoconjunctivitis, adverse food reaction and atopic dermatitis. Her previous allergy office visit was on 11/21/2019 with Dr. Maudie Mercury. Today is a regular follow up visit. Failed to follow up as recommended.  Mild persistent asthma Patient went to urgent care due to coughing, wheezing - treated with prednisone and antibiotics last week. Doing much better now.   Patient ran out of Arnuity and hasn't been using it as she said the prescription didn't have anymore refills - she wasn't sure if it was supposed to a continued medication or not. She is not sure if it helped or not because she took it so long ago.  Still taking Singulair 10mg  daily.  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   Allergic rhinoconjunctivitis Slight nasal congestion. Currently not on any daily nasal sprays.  Adverse food reaction Avoiding soy, almond and pork. No reactions since the last visit.   Other atopic dermatitis Using the triamcinolone on her shin area on the spots on a daily basis - advised to only use if needed on eczema flare.   Assessment and Plan: Andrea Bruce is a 50 y.o. female with: Mild persistent asthma, uncomplicated Only used Arnuity for  a few months after the last visit. Currently had an asthma flare requiring prednisone and antibiotics.  Today's spirometry was normal. Finish prednisone and antibiotics. Daily controller medication(s): RESTART Arnuity 239mcg 1 puff once a day and rinse mouth afterwards Continue Singulair (montelukast) 10mg  daily at night. Prior to physical activity: May use albuterol rescue inhaler 2 puffs 5 to 15 minutes prior to strenuous physical activities. Rescue medications: May use albuterol rescue inhaler 2-4 puffs every 4 to 6 hours as needed for shortness of breath, chest tightness, coughing, and wheezing. Monitor frequency of use.  Get spirometry at next visit.  Allergic rhinoconjunctivitis Past history - 2019 skin testing was positive to grass and mold.  Interim history - nasal congestion. Continue environmental cortol measures. Continue Singulair (montelukast) 10mg  daily at night. Use over the counter antihistamines such as Zyrtec (cetirizine), Claritin (loratadine), Allegra (fexofenadine), or Xyzal (levocetirizine) daily as needed. May take twice a day during allergy flares. May switch antihistamines every few months. Use Flonase (fluticasone) nasal spray 1 spray per nostril twice a day as needed for nasal congestion.  Nasal saline spray (i.e., Simply Saline) or nasal saline lavage (i.e., NeilMed) is recommended as needed and prior to medicated nasal sprays. Use olopatadine eye drops 0.2% once a day as needed for itchy/watery eyes.  Adverse food reaction Past history - 2021 bloodwork negative to soy, pork, peanuts and tree nuts. Patient not interested in food challenges at this time. Continue to avoid soy, almond and pork.  For mild symptoms you can take over the counter antihistamines such as  Benadryl and monitor symptoms closely. If symptoms worsen or if you have severe symptoms including breathing issues, throat closure, significant swelling, whole body hives, severe diarrhea and vomiting,  lightheadedness then inject epinephrine and seek immediate medical care afterwards.  Other atopic dermatitis Continue skin care measures. May use topical triamcinolone 0.1% cream twice a day as needed for eczema. Do not use on the face, neck, armpits or groin area. Do not use more than 3 weeks in a row.   Return in about 3 months (around 05/10/2021).  Meds ordered this encounter  Medications   Fluticasone Furoate (ARNUITY ELLIPTA) 200 MCG/ACT AEPB    Sig: Inhale 1 puff into the lungs daily. Rinse mouth after each use    Dispense:  30 each    Refill:  3   DISCONTD: albuterol (VENTOLIN HFA) 108 (90 Base) MCG/ACT inhaler    Sig: Inhale 2 puffs into the lungs every 4 (four) hours as needed for wheezing or shortness of breath.    Dispense:  18 g    Refill:  1   montelukast (SINGULAIR) 10 MG tablet    Sig: Take 1 tablet (10 mg total) by mouth at bedtime.    Dispense:  90 tablet    Refill:  2   triamcinolone ointment (KENALOG) 0.1 %    Sig: Apply 1 application topically 2 (two) times daily as needed (rash flare). Do not use on the face, neck, armpits or groin area. Do not use more than 3 weeks in a row.    Dispense:  80 g    Refill:  1   albuterol (VENTOLIN HFA) 108 (90 Base) MCG/ACT inhaler    Sig: Inhale 2-4 puffs into the lungs every 4 (four) hours as needed for wheezing or shortness of breath (coughing fits).    Dispense:  18 g    Refill:  2    Lab Orders  No laboratory test(s) ordered today    Diagnostics: Spirometry:  Tracings reviewed. Her effort: Good reproducible efforts. FVC: 2.28L FEV1: 2.00L, 92% predicted FEV1/FVC ratio: 88% Interpretation: Spirometry consistent with normal pattern.  Please see scanned spirometry results for details.  Medication List:  Current Outpatient Medications  Medication Sig Dispense Refill   albuterol (VENTOLIN HFA) 108 (90 Base) MCG/ACT inhaler Inhale 2-4 puffs into the lungs every 4 (four) hours as needed for wheezing or shortness of  breath (coughing fits). 18 g 2   azelastine (ASTELIN) 0.1 % nasal spray Take 1-2 sprays twice a day as needed for runny nose. 30 mL 5   diclofenac (VOLTAREN) 75 MG EC tablet Take 1 tablet (75 mg total) by mouth 2 (two) times daily. 14 tablet 0   EPINEPHrine (EPIPEN 2-PAK) 0.3 mg/0.3 mL IJ SOAJ injection EpiPen 2-Pak 0.3 mg/0.3 mL injection, auto-injector 2 each 1   escitalopram (LEXAPRO) 10 MG tablet Take 10 mg by mouth daily.     fluticasone (FLONASE) 50 MCG/ACT nasal spray Place 1 spray into both nostrils in the morning and at bedtime. For nasal congestion. 16 g 5   Fluticasone Furoate (ARNUITY ELLIPTA) 200 MCG/ACT AEPB Inhale 1 puff into the lungs daily. Rinse mouth after each use 30 each 3   hydrochlorothiazide (HYDRODIURIL) 25 MG tablet Take 25 mg by mouth daily.      ibuprofen (ADVIL,MOTRIN) 800 MG tablet Take 1 tablet (800 mg total) by mouth 3 (three) times daily. 21 tablet 0   LORazepam (ATIVAN) 0.5 MG tablet Take 0.5 mg by mouth as needed for anxiety.  metoprolol tartrate (LOPRESSOR) 25 MG tablet Take 25 mg by mouth daily.      montelukast (SINGULAIR) 10 MG tablet Take 1 tablet (10 mg total) by mouth at bedtime. 90 tablet 2   Olopatadine HCl (PATADAY) 0.2 % SOLN Place 1 drop into both eyes daily as needed (itchy/watery eyes). 2.5 mL 5   pantoprazole (PROTONIX) 40 MG tablet Take 1 tablet (40 mg total) by mouth daily. 30 tablet 3   Potassium Chloride ER 20 MEQ TBCR Take 20 mEq by mouth daily.      triamcinolone ointment (KENALOG) 0.1 % Apply 1 application topically 2 (two) times daily as needed (rash flare). Do not use on the face, neck, armpits or groin area. Do not use more than 3 weeks in a row. 80 g 1   No current facility-administered medications for this visit.   Allergies: Allergies  Allergen Reactions   Penicillins     Childhood reaction   Latex Hives and Rash    Gloves & condoms   I reviewed her past medical history, social history, family history, and environmental  history and no significant changes have been reported from her previous visit.  Review of Systems  Constitutional:  Negative for appetite change, chills, fever and unexpected weight change.  HENT:  Positive for congestion. Negative for rhinorrhea.   Eyes:  Negative for itching.  Respiratory:  Positive for cough. Negative for chest tightness, shortness of breath and wheezing.   Gastrointestinal:  Negative for abdominal pain.  Skin:  Positive for rash.  Allergic/Immunologic: Positive for environmental allergies.  Neurological:  Negative for headaches.   Objective: LMP 04/24/2013  There is no height or weight on file to calculate BMI. Physical Exam Vitals and nursing note reviewed.  Constitutional:      Appearance: Normal appearance. She is well-developed.  HENT:     Head: Normocephalic and atraumatic.     Right Ear: Tympanic membrane and external ear normal.     Left Ear: Tympanic membrane and external ear normal.     Nose: Nose normal.     Mouth/Throat:     Mouth: Mucous membranes are moist.     Pharynx: Oropharynx is clear.  Eyes:     Conjunctiva/sclera: Conjunctivae normal.  Cardiovascular:     Rate and Rhythm: Normal rate and regular rhythm.     Heart sounds: Normal heart sounds. No murmur heard.   No friction rub. No gallop.  Pulmonary:     Effort: Pulmonary effort is normal.     Breath sounds: Normal breath sounds. No wheezing or rales.  Musculoskeletal:     Cervical back: Neck supple.  Skin:    General: Skin is warm.     Findings: Rash present.     Comments: Areas of hyperpigmentation on the anterior shin area.  Neurological:     Mental Status: She is alert and oriented to person, place, and time.  Psychiatric:        Behavior: Behavior normal.  Previous notes and tests were reviewed. The plan was reviewed with the patient/family, and all questions/concerned were addressed.  It was my pleasure to see Andrea Bruce today and participate in her care. Please feel free to  contact me with any questions or concerns.  Sincerely,  Rexene Alberts, DO Allergy & Immunology  [facility] office: [place] office: [phone]

## 2021-02-07 ENCOUNTER — Ambulatory Visit (INDEPENDENT_AMBULATORY_CARE_PROVIDER_SITE_OTHER): Payer: Managed Care, Other (non HMO) | Admitting: Allergy

## 2021-02-07 ENCOUNTER — Encounter: Payer: Self-pay | Admitting: Allergy

## 2021-02-07 ENCOUNTER — Other Ambulatory Visit: Payer: Self-pay

## 2021-02-07 DIAGNOSIS — L2089 Other atopic dermatitis: Secondary | ICD-10-CM | POA: Diagnosis not present

## 2021-02-07 DIAGNOSIS — J4530 Mild persistent asthma, uncomplicated: Secondary | ICD-10-CM

## 2021-02-07 DIAGNOSIS — J309 Allergic rhinitis, unspecified: Secondary | ICD-10-CM

## 2021-02-07 DIAGNOSIS — H1010 Acute atopic conjunctivitis, unspecified eye: Secondary | ICD-10-CM

## 2021-02-07 DIAGNOSIS — T781XXD Other adverse food reactions, not elsewhere classified, subsequent encounter: Secondary | ICD-10-CM | POA: Diagnosis not present

## 2021-02-07 DIAGNOSIS — H1013 Acute atopic conjunctivitis, bilateral: Secondary | ICD-10-CM

## 2021-02-07 MED ORDER — TRIAMCINOLONE ACETONIDE 0.1 % EX OINT: 1.0000 "application " | TOPICAL_OINTMENT | Freq: Two times a day (BID) | CUTANEOUS | 1 refills | Status: AC | PRN

## 2021-02-07 MED ORDER — ALBUTEROL SULFATE HFA 108 (90 BASE) MCG/ACT IN AERS: 2.0000 | INHALATION_SPRAY | RESPIRATORY_TRACT | 2 refills | Status: DC | PRN

## 2021-02-07 MED ORDER — MONTELUKAST SODIUM 10 MG PO TABS: 10.0000 mg | ORAL_TABLET | Freq: Every day | ORAL | 2 refills | Status: AC

## 2021-02-07 MED ORDER — ALBUTEROL SULFATE HFA 108 (90 BASE) MCG/ACT IN AERS: 2.0000 | INHALATION_SPRAY | RESPIRATORY_TRACT | 1 refills | Status: DC | PRN

## 2021-02-07 MED ORDER — ARNUITY ELLIPTA 200 MCG/ACT IN AEPB: 1.0000 | INHALATION_SPRAY | Freq: Every day | RESPIRATORY_TRACT | 3 refills | Status: AC

## 2021-02-07 NOTE — Patient Instructions (Addendum)
Mild persistent asthma Finish prednisone and antibiotics.  Daily controller medication(s): RESTART Arnuity 266mcg 1 puff once a day and rinse mouth afterwards Continue Singulair (montelukast) 10mg  daily at night. Prior to physical activity: May use albuterol rescue inhaler 2 puffs 5 to 15 minutes prior to strenuous physical activities. Rescue medications: May use albuterol rescue inhaler 2-4 puffs every 4 to 6 hours as needed for shortness of breath, chest tightness, coughing, and wheezing. Monitor frequency of use.  Asthma control goals:  Full participation in all desired activities (may need albuterol before activity) Albuterol use two times or less a week on average (not counting use with activity) Cough interfering with sleep two times or less a month Oral steroids no more than once a year No hospitalizations   Allergic rhinoconjunctivitis Past history - 2019 skin testing was positive to grass and mold.  Continue environmental cortol measures.  Continue Singulair (montelukast) 10mg  daily at night. Use over the counter antihistamines such as Zyrtec (cetirizine), Claritin (loratadine), Allegra (fexofenadine), or Xyzal (levocetirizine) daily as needed. May take twice a day during allergy flares. May switch antihistamines every few months. Use Flonase (fluticasone) nasal spray 1 spray per nostril twice a day as needed for nasal congestion.  Nasal saline spray (i.e., Simply Saline) or nasal saline lavage (i.e., NeilMed) is recommended as needed and prior to medicated nasal sprays. Use olopatadine eye drops 0.2% once a day as needed for itchy/watery eyes.   Adverse food reaction Continue to avoid soy, almond and pork.  For mild symptoms you can take over the counter antihistamines such as Benadryl and monitor symptoms closely. If symptoms worsen or if you have severe symptoms including breathing issues, throat closure, significant swelling, whole body hives, severe diarrhea and vomiting,  lightheadedness then inject epinephrine and seek immediate medical care afterwards.   Other atopic dermatitis Continue skin care measures. May use topical triamcinolone 0.1% cream twice a day as needed for eczema. Do not use on the face, neck, armpits or groin area. Do not use more than 3 weeks in a row.   Follow up in 3 months or sooner if needed.

## 2021-02-07 NOTE — Assessment & Plan Note (Signed)
Past history - 2021 bloodwork negative to soy, pork, peanuts and tree nuts. Patient not interested in food challenges at this time.  Continue to avoid soy, almond and pork.   For mild symptoms you can take over the counter antihistamines such as Benadryl and monitor symptoms closely. If symptoms worsen or if you have severe symptoms including breathing issues, throat closure, significant swelling, whole body hives, severe diarrhea and vomiting, lightheadedness then inject epinephrine and seek immediate medical care afterwards.

## 2021-02-07 NOTE — Assessment & Plan Note (Signed)
   Continue skin care measures.  May use topical triamcinolone 0.1% cream twice a day as needed for eczema. Do not use on the face, neck, armpits or groin area. Do not use more than 3 weeks in a row.

## 2021-02-07 NOTE — Assessment & Plan Note (Signed)
Past history - 2019 skin testing was positive to grass and mold.  Interim history - nasal congestion.  Continue environmental cortol measures.  Continue Singulair (montelukast) 10mg  daily at night.  Use over the counter antihistamines such as Zyrtec (cetirizine), Claritin (loratadine), Allegra (fexofenadine), or Xyzal (levocetirizine) daily as needed. May take twice a day during allergy flares. May switch antihistamines every few months.  Use Flonase (fluticasone) nasal spray 1 spray per nostril twice a day as needed for nasal congestion.   Nasal saline spray (i.e., Simply Saline) or nasal saline lavage (i.e., NeilMed) is recommended as needed and prior to medicated nasal sprays.  Use olopatadine eye drops 0.2% once a day as needed for itchy/watery eyes.

## 2021-02-07 NOTE — Assessment & Plan Note (Signed)
Only used Arnuity for a few months after the last visit. Currently had an asthma flare requiring prednisone and antibiotics.   Today's spirometry was normal.  Finish prednisone and antibiotics.  Daily controller medication(s): RESTART Arnuity 277mcg 1 puff once a day and rinse mouth afterwards  Continue Singulair (montelukast) 10mg  daily at night.  Prior to physical activity: May use albuterol rescue inhaler 2 puffs 5 to 15 minutes prior to strenuous physical activities.  Rescue medications: May use albuterol rescue inhaler 2-4 puffs every 4 to 6 hours as needed for shortness of breath, chest tightness, coughing, and wheezing. Monitor frequency of use.   Get spirometry at next visit.

## 2021-03-04 ENCOUNTER — Ambulatory Visit: Payer: BC Managed Care – PPO | Admitting: Family

## 2021-10-15 ENCOUNTER — Other Ambulatory Visit: Payer: Self-pay

## 2021-10-15 MED ORDER — ALBUTEROL SULFATE HFA 108 (90 BASE) MCG/ACT IN AERS
2.0000 | INHALATION_SPRAY | RESPIRATORY_TRACT | 2 refills | Status: AC | PRN
Start: 2021-10-15 — End: ?

## 2021-10-21 ENCOUNTER — Other Ambulatory Visit: Payer: Self-pay | Admitting: Allergy

## 2021-12-19 ENCOUNTER — Other Ambulatory Visit: Payer: Self-pay | Admitting: Allergy

## 2023-03-12 ENCOUNTER — Other Ambulatory Visit: Payer: Self-pay

## 2023-03-19 ENCOUNTER — Other Ambulatory Visit: Payer: Self-pay
